# Patient Record
Sex: Female | Born: 1960
Health system: Southern US, Community
[De-identification: ages and names within clinical notes are randomized; demographics above are authoritative.]

## PROBLEM LIST (undated history)

## (undated) DIAGNOSIS — D649 Anemia, unspecified: Secondary | ICD-10-CM

## (undated) DIAGNOSIS — D219 Benign neoplasm of connective and other soft tissue, unspecified: Secondary | ICD-10-CM

## (undated) HISTORY — DX: Anemia, unspecified: D64.9

## (undated) HISTORY — PX: HERNIA REPAIR: SHX51

## (undated) HISTORY — PX: TUBAL LIGATION: SHX77

## (undated) HISTORY — DX: Benign neoplasm of connective and other soft tissue, unspecified: D21.9

---

## 2010-07-24 ENCOUNTER — Other Ambulatory Visit: Payer: Self-pay | Admitting: Geriatric Medicine

## 2010-07-24 DIAGNOSIS — D219 Benign neoplasm of connective and other soft tissue, unspecified: Secondary | ICD-10-CM

## 2010-07-25 ENCOUNTER — Ambulatory Visit
Admission: RE | Admit: 2010-07-25 | Discharge: 2010-07-25 | Disposition: A | Discharge status: Home / Self Care | Payer: BC Managed Care – PPO | Source: Ambulatory Visit | Attending: Geriatric Medicine | Admitting: Geriatric Medicine

## 2010-07-25 DIAGNOSIS — D219 Benign neoplasm of connective and other soft tissue, unspecified: Secondary | ICD-10-CM

## 2015-07-08 ENCOUNTER — Ambulatory Visit (INDEPENDENT_AMBULATORY_CARE_PROVIDER_SITE_OTHER): Payer: BLUE CROSS/BLUE SHIELD | Admitting: Gynecology

## 2015-07-08 ENCOUNTER — Encounter: Payer: Self-pay | Admitting: Gynecology

## 2015-07-08 VITALS — BP 128/76 | Ht 62.0 in | Wt 135.0 lb

## 2015-07-08 DIAGNOSIS — Z862 Personal history of diseases of the blood and blood-forming organs and certain disorders involving the immune mechanism: Secondary | ICD-10-CM | POA: Diagnosis not present

## 2015-07-08 DIAGNOSIS — N950 Postmenopausal bleeding: Secondary | ICD-10-CM

## 2015-07-08 DIAGNOSIS — N3281 Overactive bladder: Secondary | ICD-10-CM

## 2015-07-08 DIAGNOSIS — Z1159 Encounter for screening for other viral diseases: Secondary | ICD-10-CM | POA: Diagnosis not present

## 2015-07-08 DIAGNOSIS — D259 Leiomyoma of uterus, unspecified: Secondary | ICD-10-CM | POA: Diagnosis not present

## 2015-07-08 DIAGNOSIS — Z01419 Encounter for gynecological examination (general) (routine) without abnormal findings: Secondary | ICD-10-CM | POA: Diagnosis not present

## 2015-07-08 LAB — URINALYSIS W MICROSCOPIC + REFLEX CULTURE
BILIRUBIN URINE: NEGATIVE
CASTS: NONE SEEN [LPF]
Crystals: NONE SEEN [HPF]
Glucose, UA: NEGATIVE
Ketones, ur: NEGATIVE
Leukocytes, UA: NEGATIVE
NITRITE: NEGATIVE
Protein, ur: NEGATIVE
SPECIFIC GRAVITY, URINE: 1.025 (ref 1.001–1.035)
Yeast: NONE SEEN [HPF]
pH: 5 (ref 5.0–8.0)

## 2015-07-08 NOTE — Patient Instructions (Addendum)
Vejiga hiperactiva en adultos (Overactive Bladder, Adult) El trastorno de vejiga hiperactiva es un grupo de sntomas urinarios. Si tiene vejiga hiperactiva, puede sentir la necesidad repentina de orinar de inmediato. Despus de sentir esta necesidad urgente, tambin puede tener prdida de orina si no puede llegar al bao con la rapidez suficiente (incontinencia urinaria). Estos sntomas pueden interferir con su trabajo diario y las actividades sociales. Adems, los sntomas de vejiga hiperactiva pueden despertarlo durante la noche. El trastorno de vejiga hiperactiva afecta las seales nerviosas entre la vejiga y el cerebro. La vejiga puede recibir la seal de vaciarse antes de que est llena. Los msculos muy sensibles tambin pueden hacer que pierda orina demasiado pronto. CAUSAS Las causas de la vejiga hiperactiva pueden ser varias: Las causas posibles son las siguientes:  Infeccin urinaria.  Infeccin de los tejidos cercanos, como la prstata.  Agrandamiento de la prstata.  Estar embarazada de ms de un beb (embarazo mltiple).  Ciruga en el tero o la uretra.  Clculos en la vejiga, inflamacin o tumores.  Consumir cafena o alcohol en exceso.  Ciertos medicamentos, en especial lo que se toman para ayudar al organismo a eliminar el lquido extra (diurticos) al aumentar la produccin de [place].  Debilidad de los msculos y nervios, especialmente a causa de lo siguiente:  Lesin en la mdula espinal.  Ictus.  Esclerosis mltiple.  La enfermedad de Parkinson.  Diabetes. Esto puede producir un volumen de orina elevado que llena la vejiga tan rpido que la necesidad urgente de Garment/textile technologist se desencadena en forma muy intensa.  Estreimiento. La acumulacin de demasiada cantidad de heces puede ejercer presin en la vejiga. FACTORES DE RIESGO Puede correr un mayor riesgo de desarrollar vejiga hiperactiva si usted:  Es Media planner.  Fuma.  Est atravesando la  menopausia.  Tiene problemas de prstata.  Tiene una enfermedad neurolgica, como ictus, demencia, enfermedad de Parkinson o esclerosis mltiple (EM).  Ingiere alimentos o bebidas que irritan la vejiga. Entre ellos se incluyen el alcohol, los alimentos picantes y la cafena.  Tiene sobrepeso o es obeso. SIGNOS Y SNTOMAS  [employer] signos y los sntomas de vejiga hiperactiva se incluyen los siguientes:  Urgencia repentina e intensa de Garment/textile technologist.  Prdida de [place].  Orinar ocho o ms veces por da.  Despertarse dos o ms veces durante la noche para Garment/textile technologist. DIAGNSTICO El mdico puede sospechar la presencia de vejiga hiperactiva en funcin de los sntomas que [place]. El [employer] har un examen fsico y revisar su historia clnica. Pueden realizarle anlisis de [place] o de [place]. Por ejemplo, puede ser necesario realizar pruebas de la funcin de la vejiga para controlar la retencin de [place]. Es posible que tambin tenga que consultar a un mdico especialista en vas urinarias (urlogo). [place] para el trastorno de vejiga hiperactiva depende de la causa y la gravedad de su enfermedad. Ciertos tratamientos pueden Associate Professor del mdico o en la clnica. Tambin puede hacer cambios en su estilo de vida en su casa. [place] opciones se incluyen las siguientes: Tratamientos Statistician. El especialista utiliza sensores para ayudarlo a Personnel officer atento a las seales del cuerpo.  Llevar un registro diario de los momentos en que necesita orinar y qu sucede despus de la necesidad urgente de Garment/textile technologist. Esto puede ayudarlo a Electrical engineer.  Entrenamiento de la vejiga. Esto lo ayuda a aprender a Aeronautical engineer necesidad urgente de Garment/textile technologist al seguir un programa que lo obliga a Garment/textile technologist en intervalos regulares (  vaciamiento cronometrado). Al principio, es posible que tenga que esperar unos minutos despus de sentir la necesidad urgente de  Garment/textile technologist. Con el tiempo, debera poder [employer] con una hora de diferencia o ms.  Ejercicios de Kegel. Son ejercicios para fortalecer los msculos del piso plvico que sostienen la vejiga. La tonificacin de estos msculos puede ayudarlo a Chief Technology Officer las micciones, aun si hay hiperactividad en los msculos de la vejiga. Un especialista le ensear cmo hacer estos ejercicios en forma correcta. Deber practicarlos diariamente.  Prdida de peso. Si es obeso o tiene sobrepeso, perder [employer] sntomas de vejiga hiperactiva. Hable con su mdico acerca de cmo perder peso y si hay algn programa o mtodo especfico ms eficaz para usted.  Cambio en la dieta. Esto podra ayudar si el estreimiento empeora el trastorno de vejiga hiperactiva. El mdico o nutricionista puede explicarle de qu forma puede hacer cambios en su dieta para Theatre stage manager estreimiento. Tambin es posible que tenga que consumir menos cantidad de alcohol y cafena, y beber otros lquidos en distintos momentos del da.  Dejar de fumar.  Usar apsitos para Tax adviser las prdidas mientras espera que otros tratamientos surtan [place]. Tratamientos fsicos  Estimulacin elctrica. Los electrodos envan pulsos elctricos suaves para [employer] nervios o los msculos que ayudan a Chief Technology Officer la vejiga. En algunos casos, los electrodos se colocan fuera del cuerpo. En otros casos, pueden colocarse en el interior del cuerpo (implante). Este tratamiento puede demorar varios meses en surtir [employer].  Dispositivos complementarios. Las mujeres pueden necesitar un dispositivo de plstico que calce en la vagina y sostenga la vejiga (pesario). Medicamentos Varios medicamentos pueden ayudar a Chiropodist trastorno de vejiga hiperactiva y por lo general se utilizan junto con otros medicamentos. Algunos se inyectan en los msculos que participan en la miccin. Otros vienen en comprimidos. El mdico tambin puede indicarle lo  siguiente:  Antiespasmdicos. Estos medicamentos bloquean las seales que los nervios envan a la vejiga. Esto evita que la vejiga elimine orina en el momento incorrecto.  Antidepresivos tricclicos. Estos tipos de antidepresivos tambin [employer] de la vejiga. Ciruga  Puede implantarse un dispositivo que ayuda a Chief Technology Officer las seales nerviosas que indican cundo debe orinar.  Puede someterse a una ciruga de implante de electrodos para recibir Ship broker.  A veces, los casos muy graves de vejiga hiperactiva requieren de una ciruga para cambiar la forma de la vejiga. [place] los medicamentos solamente como se lo haya indicado el mdico.  Use los implantes o un pesario como se lo haya indicado el mdico.  Modifique su dieta o estilo de vida como se lo haya recomendado su mdico. Estos pueden incluir los siguientes:  Electronics engineer menos cantidad de lquido o beber en distintos momentos del Training and development officer. Si necesita orinar con frecuencia por las noches, es posible que tenga que dejar de beber lquidos apenas comienza la noche.  Reduzca la ingesta de cafena o alcohol. Ambos pueden empeorar el trastorno de vejiga hiperactiva. La cafena se encuentra en el caf, el t y los refrescos.  Haga ejercicios de Kegel para fortalecer los msculos.  Pierda peso si lo necesita.  Ingiera una dieta saludable y equilibrada para English as a second language teacher estreimiento.  Lleve un diario o libro de anotaciones para registrar la cantidad de lquidos que ingiere y cundo lo hace, y [place] cundo siente necesidad de Garment/textile technologist. Esto ayudar a su mdico a Administrator, arts. SOLICITE ATENCIN MDICA SI:  Los sntomas no mejoran despus de Chiropodist.  El dolor y [place].  Tiene necesidad urgente de orinar con mayor frecuencia.  Tiene fiebre. SOLICITE ATENCIN MDICA DE INMEDIATO SI: No puede controlar la vejiga para nada.   Esta  informacin no tiene Marine scientist el consejo del mdico. Asegrese de hacerle al mdico cualquier pregunta que tenga.   Document Released: 01/16/2012 Document Revised: 02/19/2014 Elsevier Interactive Patient Education 2016 [place] endometrio - Music therapist (Endometrial Biopsy, Care After) Siga estas instrucciones durante las prximas semanas. Estas indicaciones le proporcionan informacin general acerca de cmo deber cuidarse despus del procedimiento. El mdico tambin podr darle instrucciones ms especficas. El tratamiento se ha planificado de acuerdo a las prcticas mdicas actuales, pero a veces se producen problemas. Comunquese con el mdico si tiene algn problema o tiene dudas despus del procedimiento. QU ESPERAR DESPUS DEL PROCEDIMIENTO Despus del procedimiento, es tpico tener las siguientes sensaciones:  Sentir clicos leves y tendr una pequea cantidad de sangrado vaginal durante algunos das despus del procedimiento. Esto es normal. INSTRUCCIONES PARA EL CUIDADO EN EL HOGAR  Tome slo medicamentos de venta libre o recetados, segn las indicaciones del mdico.  No utilice tampones, duchas vaginales ni tenga relaciones sexuales hasta que el profesional la autorice.  Siga las indicaciones del mdico relacionadas con la restriccin a ciertas actividades, como ejercicios fsicos intensos o levantar objetos pesados. SOLICITE ATENCIN MDICA SI:  Tiene un sangrado abundante o sangra durante ms de 2 das despus del procedimiento.  Advierte un olor ftido que proviene de la vagina.  Siente escalofros o tiene fiebre.  Siente un dolor en el bajo vientre (abdominal) muy intenso. SOLICITE ATENCIN MDICA DE INMEDIATO SI:  Siente clicos intensos en el estmago o en la espalda.  Elimina cogulos grandes.  La hemorragia aumenta.  Se siente mareada, dbil, o se desmaya.   Esta informacin no tiene Marine scientist el consejo del  mdico. Asegrese de hacerle al mdico cualquier pregunta que tenga.   Document Released: 11/19/2012 Elsevier Interactive Patient Education 2016 [employer].  Uterine Fibroids Uterine fibroids are tissue masses (tumors) that can develop in the womb (uterus). They are also called leiomyomas. This type of tumor is not cancerous (benign) and does not spread to other parts of the body outside of the pelvic area, which is between the hip bones. Occasionally, fibroids may develop in the fallopian tubes, in the cervix, or on the support structures (ligaments) that surround the uterus. You can have one or many fibroids. Fibroids can vary in size, weight, and where they grow in the uterus. Some can become quite large. Most fibroids do not require medical treatment. CAUSES A fibroid can develop when a single uterine cell keeps growing (replicating). Most cells in the human body have a control mechanism that keeps them from replicating without control. SIGNS AND SYMPTOMS Symptoms may include:   Heavy bleeding during your period.  Bleeding or spotting between periods.  Pelvic pain and pressure.  Bladder problems, such as needing to urinate more often (urinary frequency) or urgently.  Inability to reproduce offspring (infertility).  Miscarriages. DIAGNOSIS Uterine fibroids are diagnosed through a physical exam. Your health care provider may feel the lumpy tumors during a pelvic exam. Ultrasonography and an MRI may be done to determine the size, location, and number of fibroids. TREATMENT Treatment may include:  Watchful waiting. This involves getting the fibroid checked by your health care provider to see if it grows or shrinks. Follow  your health care provider's recommendations for how often to have this checked.  Hormone medicines. These can be taken by mouth or given through an intrauterine device (IUD).  Surgery.  Removing the fibroids (myomectomy) or the uterus  (hysterectomy).  Removing blood supply to the fibroids (uterine artery embolization). If fibroids interfere with your fertility and you want to become pregnant, your health care provider may recommend having the fibroids removed.  HOME CARE INSTRUCTIONS  Keep all follow-up visits as directed by your health care provider. This is important.  Take medicines only as directed by your health care provider.  If you were prescribed a hormone treatment, take the hormone medicines exactly as directed.  Do not take aspirin, because it can cause bleeding.  Ask your health care provider about taking iron pills and increasing the amount of dark green, leafy vegetables in your diet. These actions can help to boost your blood iron levels, which may be affected by heavy menstrual bleeding.  Pay close attention to your period and tell your health care provider about any changes, such as:  Increased blood flow that requires you to use more pads or tampons than usual per month.  A change in the number of days that your period lasts per month.  A change in symptoms that are associated with your period, such as abdominal cramping or back pain. SEEK MEDICAL CARE IF:  You have pelvic pain, back pain, or abdominal cramps that cannot be controlled with medicines.  You have an increase in bleeding between and during periods.  You soak tampons or pads in a half hour or less.  You feel lightheaded, extra tired, or weak. SEEK IMMEDIATE MEDICAL CARE IF:  You faint.  You have a sudden increase in pelvic pain.   This information is not intended to replace advice given to you by your health care provider. Make sure you discuss any questions you have with your health care provider.   Document Released: 01/27/2000 Document Revised: 02/19/2014 Document Reviewed: 07/28/2013 Elsevier Interactive Patient Education 2016 [place] menopausia (Menopause) La menopausia es el perodo normal de la vida en el  cual los perodos menstruales cesan por completo. Se completa cuando no se tienen 12 perodos menstruales consecutivos. Ocurre generalmente en mujeres entre los 40 y los 28 aos. Muy rara vez una mujer tiene la menopausia antes de los 40 [place]. En la menopausia, los ovarios dejan de producir las hormonas femeninas estrgeno y Immunologist. Esto puede causar sntomas indeseables y Technical brewer a Technical sales engineer. A veces los sntomas aparecen entre 4 y 5 aos antes de que comience la menopausia. No existe una relacin [employer] y:  [place] anticonceptivos orales.  La cantidad de hijos que tuvo.  La raza.  La edad en que comenzaron los perodos menstruales (menarca). Las mujeres que fuman mucho y las que son muy delgadas pueden desarrollar la menopausia a una edad ms temprana. CAUSAS  Los ovarios dejan de producir las hormonas femeninas estrgeno y Immunologist.  Otras causas son:  [place] en la que se extirpan ambos ovarios.  Los ovarios que dejan de funcionar sin un motivo conocido.  Tumores de la glndula pituitaria en el cerebro.  Enfermedades que [employer] ovarios y la produccin de hormonas.  Radioterapia en el abdomen o la pelvis.  Quimioterapia que afecte a los ovarios. SNTOMAS   Acaloramiento.  Sudoracin nocturna.  Disminucin del deseo sexual.  Sequedad vaginal y adelgazamiento de la vagina, lo que causa relaciones sexuales dolorosas.  [employer]  de la piel y aparicin de [place].  Dolores de [place].  Cansancio.  Irritabilidad.  Problemas de memoria.  Aumento de [place].  Infeccin de la vejiga.  Aparicin de vello en el rostro y [place].  Infertilidad. Los sntomas ms graves pueden incluir:  Prdida de masa sea osteoporosis) que favorece la rotura de huesos(fracturas).  Depresin.  Endurecimiento y Public librarian de las arterias (aterosclerosis) lo que puede causar infarto e ictus. DIAGNSTICO   El perodo menstrual no aparece por 12 meses  seguidos.  Examen fsico.  Estudios hormonales de Herbalist. TRATAMIENTO  Hay muchas opciones de tratamiento y casi tantas dudas como opciones existen. La decisin de tratar o no los cambios que trae la menopausia es una decisin que realiza el profesional de acuerdo con cada Advertising copywriter. El Management consultant las opciones de tratamiento con usted. Juntos podrn decidir qu tratamiento es el mejor para usted. Las opciones de tratamiento pueden incluir:   Terapia hormonal (estrgenos y Immunologist).  Medicamentos no hormonales.  Tratamiento de los sntomas individuales con medicamentos (por ejemplo antidepresivos para la depresin).  Algunos medicamentos herbales pueden ayudar en sntomas especficos.  Psicoterapia con un psiclogo o psiquiatra.  Terapia grupal.  Cambios en el estilo de vida, como:  Consumir una dieta saludable.  Actividad fsica regular.  Limitar el consumo de cafena y alcohol.  Control del estrs y Public affairs consultant.  No realizar tratamiento. [place] todos los medicamentos como el mdico le indique.  Descanse y duerma lo suficiente.  Haga ejercicios regularmente.  Consuma una dieta que contenga calcio (bueno para los [place]) y productos derivados de la soja (actan como estrgenos).  Evite las bebidas alcohlicas.  No fume.  Si tiene sofocones, vstase en capas.  Tome suplementos de calcio y vitamina D para fortalecer los [place].  Puede utilizar lubricantes y humectantes de venta libre para la sequedad vaginal.  En algunos casos la terapia de grupo podr ayudarla.  La acupuntura puede ser de ayuda en ciertos casos. SOLICITE ATENCIN MDICA SI:   No est segura de estar en la menopausia.  Tiene sntomas de [place] y [place] asesoramiento y Clinical research associate.  Tiene 40 aos y an tiene Chartered certified accountant.  Tiene dolor durante las [employer].  La menopausia se ha completado (no ha  tenido el perodo menstrual durante 12 meses) y tiene un sangrado vaginal.  Necesita ser derivada a un especialista (gineclogo, psiquiatra, o psiclogo) para Arts administrator. SOLICITE ATENCIN MDICA DE INMEDIATO SI:   Siente una depresin intensa e incontrolable.  Tiene una hemorragia vaginal abundante.  Siente y cree que se ha fracturado un hueso.  Siente dolor al [employer].  Siente dolor en el pecho o en la pierna.  Tiene latidos cardacos rpidos (palpitaciones).  Siente dolor de cabeza intenso.  Tiene problemas de visin.  Siente un bulto en la mama.  Siente un dolor abdominal intenso o indigestin.   Esta informacin no tiene Marine scientist el consejo del mdico. Asegrese de hacerle al mdico cualquier pregunta que tenga.   Document Released: 03/12/2006 Document Revised: 10/01/2012 Elsevier Interactive Patient Education [employer].

## 2015-07-08 NOTE — Progress Notes (Addendum)
Allison Rice 17-Dec-1960 [id number]   History:    55 y.o.  for annual gyn exam is a new patient to the practice. [place] She stated that her mammogram was 3 years ago and was normal and her colonoscopy was 2 years ago. She has not had a Pap smear in over 3 years the last [place] done in [place] She reports no past history of any abnormal Pap smear. She has no vasomotor symptoms and states that her sister and mother reached the menopause in the mid 63s. Patient has several complaints today as follows:  #1 urinary frequency and nocturia but no dysuria #2 patient states that sometimes she'll have 2 or 3 menstrual cycles during the month. 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 #3 anemia  Past medical history,surgical history, family history and social history were all reviewed and documented in the EPIC chart.  Gynecologic History Patient's last menstrual period was 06/03/2015. Contraception: tubal ligation Last Pap: Over 3 years ago. Results were: normal Last mammogram: Over 3 years ago. Results were: normal  Obstetric History OB History  Gravida Para Term Preterm AB SAB TAB Ectopic Multiple Living  2 2        2     # Outcome Date GA Lbr Len/2nd Weight Sex Delivery Anes PTL Lv  2 Para     F Vag-Spont     1 Para     M Vag-Spont          ROS: A ROS was performed and pertinent positives and negatives are included in the history.  GENERAL: No fevers or chills. HEENT: No change in vision, no earache, sore throat or sinus congestion. NECK: No pain or stiffness. CARDIOVASCULAR: No chest pain or pressure. No palpitations. PULMONARY: No shortness of breath, cough or wheeze. GASTROINTESTINAL: No abdominal pain, nausea, vomiting or diarrhea, melena or bright red blood per rectum. GENITOURINARY: No urinary frequency, urgency, hesitancy or dysuria. MUSCULOSKELETAL:  No joint or muscle pain, no back pain, no recent trauma. DERMATOLOGIC: No rash, no itching, no lesions. ENDOCRINE: No polyuria, polydipsia, no heat or cold intolerance. No recent change in weight. HEMATOLOGICAL: No anemia or easy bruising or bleeding. NEUROLOGIC: No headache, seizures, numbness, tingling or weakness. PSYCHIATRIC: No depression, no loss of interest in normal activity or change in sleep pattern.     Exam: chaperone present  BP 128/76 mmHg  Ht 5\' 2"  (1.575 m)  Wt 135 lb (61.236 kg)  BMI 24.69 kg/m2  LMP 06/03/2015  Body mass index is 24.69 kg/(m^2).  General appearance : Well developed well nourished female. No acute distress HEENT: Eyes: no retinal hemorrhage or exudates,  Neck supple, trachea midline, no carotid bruits, no thyroidmegaly Lungs: Clear to auscultation, no rhonchi or wheezes, or rib retractions  Heart: Regular rate and rhythm, no murmurs or gallops Breast:Examined in sitting and supine position were symmetrical in appearance, no palpable masses or tenderness,  no skin retraction, no nipple inversion, no nipple discharge, no skin discoloration, no axillary or supraclavicular lymphadenopathy Abdomen: no palpable masses or tenderness, no rebound or guarding Extremities: no edema or skin discoloration or tenderness  Pelvic:  [place], Urethra, Skene Glands: Within normal limits             Vagina: No gross lesions or discharge  Cervix: No gross lesions or discharge  Uterus  anteverted, normal  size, shape and consistency, non-tender and mobile  Adnexa  Without masses or tenderness  Anus and perineum  normal   Rectovaginal  normal sphincter tone without palpated masses or tenderness             Hemoccult cards provided   Because of patient's irregular perimenopausal bleeding she was counseled for an endometrial biopsy. The cervix was cleansed with Betadine solution. The cervix was grasped with a single-tooth tenaculum and a sterile Pipelle was introduced into  the uterine cavity and tissue obtained and submitted for histological evaluation. CO2 tenaculum was removed and silver nitrate was applied to the cervix for hemostasis. Patient tolerated procedure well.  Urinalysis: Moderate bacteria, 0-2 RBC, 0-5 WBC  Assessment/Plan:  55 y.o. female for annual exam perimenopausal we'll check next week in a fasting state not only an [facility] but the following: Comprehensive metabolic panel, TSH, fasting lipid profile, CBC, and urinalysis. She will also schedule sonohysterogram for the following week. We are going to wait for the urine culture before treating it could be just a contaminant. Also we'll wait until after the sonohysterogram to assess her uterus to see indeed if maybe the fibroids may be compressing on the bladder and contributing to her urinary frequency and nocturia. Also with a sonohysterogram to rule out any intracavitary defect contributing to her irregular bleeding. Endometrial biopsy done today result pending at time of this dictation as well as her Pap smear. We will depend on the above studies before starting patient on any anti-cholinergic agent. Literature information was provided in Spanish on the following: Overactive bladder, perimenopause, an endometrial biopsy.   New CDC guidelines is recommending patients be tested once in her lifetime for hepatitis C antibody who were born between [birth year] through 1964. This was discussed with the patient today and has agreed to be tested today.  Patient was provided with requisition to schedule her mammogram.   Terrance Mass MD, 9:55 AM 07/08/2015

## 2015-07-08 NOTE — Addendum Note (Signed)
Addended by: Thurnell Garbe A on: 07/08/2015 10:12 AM   Modules accepted: Orders

## 2015-07-10 LAB — URINE CULTURE

## 2015-07-12 ENCOUNTER — Telehealth: Payer: Self-pay | Admitting: Gynecology

## 2015-07-12 ENCOUNTER — Other Ambulatory Visit: Payer: BLUE CROSS/BLUE SHIELD

## 2015-07-12 LAB — CBC WITH DIFFERENTIAL/PLATELET
Basophils Absolute: 0 cells/uL (ref 0–200)
Basophils Relative: 0 %
EOS PCT: 2 %
Eosinophils Absolute: 70 cells/uL (ref 15–500)
HCT: 31.7 % — ABNORMAL LOW (ref 35.0–45.0)
HEMOGLOBIN: 10.1 g/dL — AB (ref 11.7–15.5)
LYMPHS ABS: 1050 {cells}/uL (ref 850–3900)
Lymphocytes Relative: 30 %
MCH: 27.4 pg (ref 27.0–33.0)
MCHC: 31.9 g/dL — ABNORMAL LOW (ref 32.0–36.0)
MCV: 85.9 fL (ref 80.0–100.0)
MONOS PCT: 7 %
MPV: 11 fL (ref 7.5–12.5)
Monocytes Absolute: 245 cells/uL (ref 200–950)
NEUTROS ABS: 2135 {cells}/uL (ref 1500–7800)
Neutrophils Relative %: 61 %
PLATELETS: 200 10*3/uL (ref 140–400)
RBC: 3.69 MIL/uL — AB (ref 3.80–5.10)
RDW: 13.1 % (ref 11.0–15.0)
WBC: 3.5 10*3/uL — AB (ref 3.8–10.8)

## 2015-07-12 LAB — COMPREHENSIVE METABOLIC PANEL
ALBUMIN: 4 g/dL (ref 3.6–5.1)
ALT: 7 U/L (ref 6–29)
AST: 15 U/L (ref 10–35)
Alkaline Phosphatase: 56 U/L (ref 33–130)
BILIRUBIN TOTAL: 0.3 mg/dL (ref 0.2–1.2)
BUN: 14 mg/dL (ref 7–25)
CHLORIDE: 104 mmol/L (ref 98–110)
CO2: 27 mmol/L (ref 20–31)
CREATININE: 0.62 mg/dL (ref 0.50–1.05)
Calcium: 8.8 mg/dL (ref 8.6–10.4)
Glucose, Bld: 86 mg/dL (ref 65–99)
Potassium: 4.1 mmol/L (ref 3.5–5.3)
SODIUM: 139 mmol/L (ref 135–146)
TOTAL PROTEIN: 6.8 g/dL (ref 6.1–8.1)

## 2015-07-12 LAB — LIPID PANEL
CHOLESTEROL: 186 mg/dL (ref 125–200)
HDL: 82 mg/dL (ref >=46)
LDL CALC: 87 mg/dL (ref <130)
TRIGLYCERIDES: 86 mg/dL (ref <150)
Total CHOL/HDL Ratio: 2.3 Ratio (ref <=5.0)
VLDL: 17 mg/dL (ref <30)

## 2015-07-12 LAB — FOLLICLE STIMULATING HORMONE: FSH: 103.2 m[IU]/mL

## 2015-07-12 LAB — TSH: TSH: 2.73 m[IU]/L

## 2015-07-12 NOTE — Telephone Encounter (Signed)
07/12/15-Pt informed today that her [facility] will cover the sonohysterogram under her $10 copay. Per Kurt@BC -Ref#[id number]

## 2015-07-13 ENCOUNTER — Other Ambulatory Visit: Payer: Self-pay | Admitting: Gynecology

## 2015-07-13 DIAGNOSIS — N950 Postmenopausal bleeding: Secondary | ICD-10-CM

## 2015-07-13 LAB — PAP, TP IMAGING W/ HPV RNA, RFLX HPV TYPE 16,18/45: HPV mRNA, High Risk: NOT DETECTED

## 2015-07-13 LAB — HEPATITIS C ANTIBODY: HCV Ab: NEGATIVE

## 2015-07-19 ENCOUNTER — Other Ambulatory Visit: Payer: Self-pay | Admitting: Anesthesiology

## 2015-07-19 DIAGNOSIS — Z1211 Encounter for screening for malignant neoplasm of colon: Secondary | ICD-10-CM

## 2015-07-20 ENCOUNTER — Ambulatory Visit (INDEPENDENT_AMBULATORY_CARE_PROVIDER_SITE_OTHER): Payer: BLUE CROSS/BLUE SHIELD | Admitting: Gynecology

## 2015-07-20 ENCOUNTER — Other Ambulatory Visit: Payer: Self-pay | Admitting: Gynecology

## 2015-07-20 ENCOUNTER — Ambulatory Visit (INDEPENDENT_AMBULATORY_CARE_PROVIDER_SITE_OTHER): Payer: BLUE CROSS/BLUE SHIELD

## 2015-07-20 DIAGNOSIS — D251 Intramural leiomyoma of uterus: Secondary | ICD-10-CM

## 2015-07-20 DIAGNOSIS — N950 Postmenopausal bleeding: Secondary | ICD-10-CM

## 2015-07-20 DIAGNOSIS — D500 Iron deficiency anemia secondary to blood loss (chronic): Secondary | ICD-10-CM

## 2015-07-20 DIAGNOSIS — D259 Leiomyoma of uterus, unspecified: Secondary | ICD-10-CM

## 2015-07-20 DIAGNOSIS — N83202 Unspecified ovarian cyst, left side: Secondary | ICD-10-CM

## 2015-07-20 NOTE — Progress Notes (Signed)
   Patient is a 55 year old who was seen the office on May 26 for the first time as a new patient to the practice.She is [place] She stated that her mammogram was 3 years ago and was normal and her colonoscopy was 2 years ago. She has not had a Pap smear in over 3 years the last one done in [place] She reports no past history of any abnormal Pap smear. She has no vasomotor symptoms and states that her sister and mother reached the menopause in the mid 45s. Patient complained in the month of April she had 3 menstrual cycles and she is here for sonohysterogram today. She did have an endometrial biopsy at that last office visit and pathology report demonstrated the following:  Diagnosis Endometrium, biopsy, uterus - ATROPHIC ENDOMETRIUM WITH BREAKDOWN. - NO HYPERPLASIA, ATYPIA OR MALIGNANCY IDENTIFIED.  She also had a normal Pap smear.  Patient with past tubal ligation.  Ultrasound/sono hysterogram: Uterus measured 10.3 x 8.1 x 5.4 cm with endometrial stripe of 3.4 mm. Patient with a right intramural fibroid measuring 26 x 20 mm. Right ovary was normal. Left ovary thinwall cyst measuring 50 x 14 mm with a thin septum negative color flow. No fluid in the cul-de-sac. The cervix was then cleansed with Betadine solution and a sterile catheter was introduced into the uterine cavity and no intracavitary defect was noted.   Patient's recent blood work assisting of comprehensive metabolic panel, lipid profile, TSH were normal. Her CBC indicated that her hemoglobin was 10.1 hematocrit 31.7 and her FSH was elevated at 103.2.  Assessment/plan: 55 year old patient perimenopausal no vasomotor symptoms elevated FSH family history of menopause mid 39s. Patient with 3 episodes of bleeding April. Evaluation consisting of endometrial biopsy sonohysterogram was negative. A small left ovarian cyst was noted today with a thin septum and for this reason a CA 125 will be drawn today.  Patient will be started on iron supplementation 1 by mouth daily and will return back in 6 months for follow-up CBC. She will maintain a menstrual calendar that time if any further irregular bleeding we'll repeat the above evaluations. All the above information was provided in [place].

## 2015-07-20 NOTE — Patient Instructions (Addendum)
Marcador tumoral CA-125 (CA-125 Tumor Marker Test) POR QU ME DEBO REALIZAR ESTA PRUEBA? Esta prueba tiene como fin verificar el nivel del antgeno del cncer125 (CA-125) en la sangre. La prueba de marcador tumoral CA-125 puede ser til para detectar el cncer de ovario y se realiza solamente si se considera que la mujer corre un alto riesgo de presentar este tipo de cncer. El mdico puede recomendarle esta prueba si:  Usted tiene importantes antecedentes familiares de cncer de ovario.  Usted tiene un defecto gentico en el antgeno del cncer de mama (BRCA, por sus siglas en ingls). Si ya le han diagnosticado cncer de ovario, el mdico puede hacerle esta prueba como ayuda para identificar el grado de la enfermedad y controlar cmo responde al tratamiento. QU TIPO DE MUESTRA SE TOMA? Para esta prueba, se extrae una muestra de sangre. Por lo general, para extraerla, se introduce una aguja en una vena. CMO DEBO PREPARARME PARA ESTA PRUEBA? No se requiere una preparacin para esta prueba. QU SON LOS VALORES DE REFERENCIA? Los valores de referencia son los valores saludables establecidos despus de realizarle el anlisis a un grupo grande de personas sanas. Pueden variar entre diferentes personas, laboratorios y hospitales. Es su responsabilidad retirar el resultado del estudio. Consulte en el laboratorio o en el departamento en el que fue realizado el estudio cundo y cmo podr obtener los resultados. El valor de referencia para esta prueba es de 0a 35unidades/ml o menos de 35unidades/l (unidades SI). QU SIGNIFICAN LOS RESULTADOS? Los niveles ms altos de CA-125 pueden indicar lo siguiente:  Ciertos tipos de cncer, entre ellos:  Cncer de ovario.  Cncer de pncreas.  Cncer de colon.  Cncer de pulmn.  Cncer de mama.  Linfomas.  Trastornos no cancerosos (benignos), entre ellos:  Cirrosis.  Embarazo.  Endometriosis.  Pancreatitis.  Enfermedad plvica  inflamatoria (EPI). Hable con el mdico sobre los resultados, las opciones de tratamiento y, si es necesario, la necesidad de realizar ms estudios. Hable con el mdico si tiene alguna pregunta sobre los resultados.   Esta informacin no tiene como fin reemplazar el consejo del mdico. Asegrese de hacerle al mdico cualquier pregunta que tenga.   Document Released: 11/19/2012 Document Revised: 02/19/2014 Elsevier Interactive Patient Education 2016 [employer] Inc. Quiste ovrico (Ovarian Cyst) Un quiste ovrico es una bolsa llena de lquido que se forma en el ovario. Los ovarios son los rganos pequeos que producen vulos en las mujeres. Se pueden formar varios tipos de quistes en los ovarios. La mayora no son cancerosos. Muchos de ellos no causan problemas y con frecuencia desaparecen solos. Algunos pueden provocar sntomas y requerir tratamiento. Los tipos ms comunes de quistes ovricos son los siguientes:  Quistes funcionales: estos quistes pueden aparecer todos los meses durante el ciclo menstrual. Esto es normal. Estos quistes suelen desaparecer con el prximo ciclo menstrual si la mujer no queda embarazada. En general, los quistes funcionales no tienen sntomas.  Endometriomas: estos quistes se forman a partir del tejido que recubre el tero. Tambin se denominan "quistes de chocolate" porque se llenan de sangre que se vuelve marrn. Este tipo de quiste puede provocar dolor en la zona inferior del abdomen durante la relacin sexual y con el perodo menstrual.  Cistoadenomas: este tipo se desarrolla a partir de las clulas que se ubican en el exterior del ovario. Estos quistes pueden ser muy grandes y causar dolor en la zona inferior del abdomen y durante la relacin sexual. Este tipo de quiste puede girar sobre s mismo, cortar   el suministro de sangre y causar un dolor intenso. Tambin se puede romper con facilidad y provocar mucho dolor.  Quistes dermoides: este tipo de quiste a veces se  encuentra en ambos ovarios. Estos quistes pueden contener diferentes tipos de tejidos del organismo, como piel, dientes, pelo o cartlago. Generalmente no tienen sntomas, a menos que sean muy grandes.  Quistes tecalutenicos: aparecen cuando se produce demasiada cantidad de cierta hormona (gonadotropina corinica humana) que estimula en exceso al ovario para que produzca vulos. Esto es ms frecuente despus de procedimientos que ayudan a la concepcin de un beb (fertilizacin in vitro). CAUSAS   Los medicamentos para la fertilidad pueden provocar una afeccin mediante la cual se forman mltiples quistes de gran tamao en los ovarios. Esta se denomina sndrome de hiperestimulacin ovrica.  El sndrome del ovario poliqustico es una afeccin que puede causar desequilibrios hormonales, los cuales pueden dar como resultado quistes ovricos no funcionales. SIGNOS Y SNTOMAS  Muchos quistes ovricos no causan sntomas. Si se presentan sntomas, stos pueden ser:  Dolor o molestias en la pelvis.  Dolor en la parte baja del abdomen.  Dolor durante las relaciones sexuales.  Aumento del permetro abdominal (hinchazn).  Perodos menstruales anormales.  Aumento del dolor en los perodos menstruales.  Cese de los perodos menstruales sin estar embarazada. DIAGNSTICO  Estos quistes se descubren comnmente durante un examen de rutina o una exploracin ginecolgica anual. Es posible que se ordenen otros estudios para obtener ms informacin sobre el quiste. Estos estudios pueden ser:  Ecografas.  Radiografas de la pelvis.  Tomografa computada.  Resonancia magntica.  Anlisis de sangre. TRATAMIENTO  Muchos de los quistes ovricos desaparecen por s solos, sin tratamiento. Es probable que el mdico quiera controlar el quiste regularmente durante 2 o 3meses para ver si se produce algn cambio. En el caso de las mujeres en la menopausia, es particularmente importante controlar de cerca al  quiste ya que el ndice de cncer de ovario en las mujeres menopusicas es ms alto. Cuando se requiere tratamiento, este puede incluir cualquiera de los siguientes:  Un procedimiento para drenar el quiste (aspiracin). Esto se puede realizar mediante el uso de una aguja grande y una ecografa. Tambin se puede hacer a travs de un procedimiento laparoscpico, En este procedimiento, se inserta un tubo delgado que emite luz y que tiene una pequea cmara en un extremo (laparoscopio) a travs de una pequea incisin.  Ciruga para extirpar el quiste completo. Esto se puede realizar mediante una ciruga laparoscpica o una ciruga abierta, la cual implica realizar una incisin ms grande en la parte inferior del abdomen.  Tratamiento hormonal o pldoras anticonceptivas. Estos mtodos a veces se usan para ayudar a disolver un quiste. INSTRUCCIONES PARA EL CUIDADO EN EL HOGAR   Tome solo medicamentos de venta libre o recetados, segn las indicaciones del mdico.  Concurra a las consultas de control con su mdico segn las indicaciones.  Hgase exmenes plvicos regulares y pruebas de Papanicolaou. SOLICITE ATENCIN MDICA SI:   Los perodos se atrasan, son irregulares, dolorosos o cesan.  El dolor plvico o abdominal no desaparece.  El abdomen se agranda o se hincha.  Siente presin en la vejiga o no puede vaciarla completamente.  Siente dolor durante las relaciones sexuales.  Tiene una sensacin de hinchazn, presin o molestias en el estmago.  Pierde peso sin razn aparente.  Siente un malestar generalizado.  Est estreida.  Pierde el apetito.  Le aparece acn.  Nota un aumento del vello corporal y facial.    Aumenta de peso sin hacer modificaciones en su actividad fsica y en su dieta habitual.  Sospecha que est embarazada. SOLICITE ATENCIN MDICA DE INMEDIATO SI:   Siente cada vez ms dolor abdominal.  Tiene malestar estomacal (nuseas) y vomita.  Tiene fiebre que se  presenta de manera repentina.  Siente dolor abdominal al defecar.  Sus perodos menstruales son ms abundantes que lo habitual. ASEGRESE DE QUE:   Comprende estas instrucciones.  Controlar su afeccin.  Recibir ayuda de inmediato si no mejora o si empeora.   Esta informacin no tiene como fin reemplazar el consejo del mdico. Asegrese de hacerle al mdico cualquier pregunta que tenga.   Document Released: 11/08/2004 Document Revised: 02/03/2013 Elsevier Interactive Patient Education 2016 Elsevier Inc.  

## 2015-07-21 LAB — CA 125: CA 125: 10 U/mL (ref <35)

## 2016-04-17 ENCOUNTER — Ambulatory Visit (INDEPENDENT_AMBULATORY_CARE_PROVIDER_SITE_OTHER): Payer: BLUE CROSS/BLUE SHIELD | Admitting: Gynecology

## 2016-04-17 ENCOUNTER — Encounter: Payer: Self-pay | Admitting: Gynecology

## 2016-04-17 VITALS — BP 122/80

## 2016-04-17 DIAGNOSIS — R350 Frequency of micturition: Secondary | ICD-10-CM

## 2016-04-17 DIAGNOSIS — N3281 Overactive bladder: Secondary | ICD-10-CM

## 2016-04-17 DIAGNOSIS — R3915 Urgency of urination: Secondary | ICD-10-CM

## 2016-04-17 LAB — URINALYSIS W MICROSCOPIC + REFLEX CULTURE
Bilirubin Urine: NEGATIVE
CASTS: NONE SEEN [LPF]
CRYSTALS: NONE SEEN [HPF]
Glucose, UA: NEGATIVE
HGB URINE DIPSTICK: NEGATIVE
KETONES UR: NEGATIVE
Leukocytes, UA: NEGATIVE
NITRITE: NEGATIVE
PROTEIN: NEGATIVE
RBC / HPF: NONE SEEN RBC/HPF (ref <=2)
SPECIFIC GRAVITY, URINE: 1.01 (ref 1.001–1.035)
YEAST: NONE SEEN [HPF]
pH: 7 (ref 5.0–8.0)

## 2016-04-17 MED ORDER — FESOTERODINE FUMARATE ER 4 MG PO TB24: 4.0000 mg | ORAL_TABLET | Freq: Every day | ORAL | 11 refills | Status: DC

## 2016-04-17 NOTE — Progress Notes (Signed)
   Patient is a 56 year old that presented to the office today with complaint of suprapubic discomfort and low back discomfort. She denied any fever, chills, nausea, vomiting. She had visited her [place] recently a stated she saw urologist which she was having the symptoms and started on Cipro twice a day for 7 days which she will finish tomorrow. Patient states this suprapubic discomfort is been going on for quite some time. She also states that she gets of 56 times a night to urinate and has urinary frequency during the day. On questioning her she does not consume large amounts of fluids or any caffeine-containing products. Patient denied any past history of any kidney stones.  Exam: Back: No CVA tenderness Abdomen: Soft nontender no rebound or guarding Pelvic: Bartholin urethra Skene was within normal limits Vagina: No lesions or discharge Uterus: Anteverted normal size shape and consistency some mild suprapubic discomfort Adnexa: No palpable masses or tenderness Rectal exam: Not done  Urinalysis few bacteria otherwise normal culture pending  Assessment/plan: Patient with signs and symptoms of detrusor dyssynergia (overactive bladder) with going to start her on Toviaz 4 mg daily for her symptoms. If her urgency and nocturia improved but she continues to have suprapubic discomfort I'm going to refer to the urologist for possible evaluation for underlying interstitial cystitis. I've asked her to finish her antibiotic that was prescribed by urologist in the [place] Literature information was provided on overactive bladder and on interstitial cystitis.

## 2016-04-17 NOTE — Patient Instructions (Signed)
Cistitis intersticial (Interstitial Cystitis) La cistitis intersticial es una afeccin que causa inflamacin de la vejiga. La vejiga es un rgano hueco que se encuentra en la parte inferior del abdomen. Almacena la orina que producen los riones. La cistitis intersticial puede causar dolor en la zona de la vejiga. Tambin es posible que tenga la necesidad frecuente y urgente de Garment/textile technologist. La gravedad de la cistitis intersticial puede variar de Ardelia Mems persona a la otra. Puede presentar exacerbaciones de la afeccin, que luego quizs desaparezcan durante un [place]. Para muchas personas, esta afeccin se convierte en un problema a largo plazo. CAUSAS Se desconoce la causa de esta afeccin. FACTORES DE RIESGO Es ms probable que esta afeccin se manifieste en las mujeres. SNTOMAS Los sntomas de la cistitis intersticial varan y pueden modificarse con Physiological scientist. Entre los sntomas se pueden incluir los siguientes:  Molestias o dolor en la zona de la vejiga. Pueden variar de leves a graves. La intensidad del dolor puede variar en funcin de si la vejiga est llena con orina o vaca.  Dolor en la pelvis.  Necesidad urgente de Garment/textile technologist.  Ganas frecuentes de Garment/textile technologist.  Captiva.  Sangrado en forma de puntitos en la pared de la vejiga. En las mujeres, los sntomas a menudo Scientist, research (physical sciences). DIAGNSTICO Esta afeccin se diagnostica evaluando los sntomas y descartando otras causas. Se le practicar un examen fsico. Pueden hacerle diversos estudios para descartar otras afecciones. Entre los ms frecuentes, se incluyen los siguientes:  Anlisis de [place].  Una cistoscopa. En este estudio, se utiliza un instrumento similar a un telescopio muy pequeo para observar la vejiga.  Biopsia. La biopsia consiste en extraer Truddie Coco de tejido de la pared de la vejiga para examinar con un microscopio. TRATAMIENTO La cistitis intersticial no tiene cura, pero hay  tratamientos para controlar los sntomas. Trabaje en estrecha colaboracin con el mdico a fin de hallar los tratamientos que sean ms eficaces para su caso. Las opciones de tratamiento son las siguientes:  Medicamentos que alivien el dolor y ayuden a reducir la cantidad de veces que sienta la necesidad de Garment/textile technologist.  Entrenamiento de la vejiga. Esto consiste en aprender [employer] de controlar las micciones, por ejemplo:  Orinar en horarios programados.  Entrenarse para Radiation protection practitioner.  Hacer ejercicios (ejercicios de Kegel) para fortalecer los msculos que controlan el flujo de [place].  Cambios en el estilo de vida, por ejemplo, modificar la dieta o tomar medidas para Scientific laboratory technician.  Uso de un dispositivo que emite estimulacin elctrica para Dietitian.  Un procedimiento que extiende la vejiga al llenarla de aire o lquido.  Ciruga. Esto es raro. Se realiza solamente en casos extremos si los otros tratamientos no son eficaces. [place] los medicamentos solamente como se lo haya indicado el mdico.  Practique las tcnicas de entrenamiento de la vejiga como se lo hayan indicado.  Lleve un diario de micciones para determinar qu alimentos, lquidos o [employer] sntomas.  Use el diario de micciones para programar las veces que va al bao. Si saldr de su casa, organcese de modo de poder cumplir con la programacin de micciones.  Asegrese de orinar antes de salir de su casa y antes de [place].  Haga los ejercicios de Kegel como se lo haya indicado el mdico.  No beba alcohol.  No consuma ningn producto que contenga tabaco, lo que incluye cigarrillos, tabaco de Higher education careers adviser o Psychologist, sport and exercise.  Si necesita ayuda para dejar de fumar, consulte al mdico.  Haga cambios en la dieta como se lo haya indicado el mdico. Quizs deba evitar los alimentos picantes y los alimentos con un alto contenido de [place].  Limite  la ingesta de bebidas que estimulen la miccin. Estas bebidas incluyen refrescos, caf y t.  Concurra a todas las visitas de control como se lo haya indicado el mdico. Esto es importante. SOLICITE ATENCIN MDICA SI:  Los sntomas no mejoran despus de Chiropodist.  El dolor y [place].  Tiene necesidad urgente de orinar con mayor frecuencia.  Tiene fiebre. SOLICITE ATENCIN MDICA DE INMEDIATO SI:  No puede controlar la vejiga para nada. Esta informacin no tiene Marine scientist el consejo del mdico. Asegrese de hacerle al mdico cualquier pregunta que tenga. Document Released: 05/17/2008 Document Revised: 02/19/2014 Document Reviewed: 10/06/2013 Elsevier Interactive Patient Education  2017 [place] hiperactiva en adultos (Overactive Bladder, Adult) El trastorno de vejiga hiperactiva es un grupo de sntomas urinarios. Si tiene vejiga hiperactiva, puede sentir la necesidad repentina de orinar de inmediato. Despus de sentir esta necesidad urgente, tambin puede tener prdida de orina si no puede llegar al bao con la rapidez suficiente (incontinencia urinaria). Estos sntomas pueden interferir con su trabajo diario y las actividades sociales. [place], los sntomas de vejiga hiperactiva pueden despertarlo durante la noche. El trastorno de vejiga hiperactiva afecta las seales nerviosas entre la vejiga y el cerebro. La vejiga puede recibir la seal de vaciarse antes de que est llena. Los msculos muy sensibles tambin pueden hacer que pierda orina demasiado pronto. CAUSAS Las causas de la vejiga hiperactiva pueden ser varias: Las causas posibles son las siguientes:  Infeccin urinaria.  Infeccin de los tejidos cercanos, como la prstata.  Agrandamiento de la prstata.  Estar embarazada de ms de un beb (embarazo mltiple).  Ciruga en el tero o la uretra.  Clculos en la vejiga, inflamacin o tumores.  Consumir cafena o alcohol en  exceso.  Ciertos medicamentos, en especial lo que se toman para ayudar al organismo a eliminar el lquido extra (diurticos) al aumentar la produccin de [place].  Debilidad de los msculos y nervios, especialmente a causa de lo siguiente:  Lesin en la mdula espinal.  Ictus.  Esclerosis mltiple.  La enfermedad de Parkinson.  Diabetes. Esto puede producir un volumen de orina elevado que llena la vejiga tan rpido que la necesidad urgente de Garment/textile technologist se desencadena en forma muy intensa.  Estreimiento. La acumulacin de demasiada cantidad de heces puede ejercer presin en la vejiga. FACTORES DE RIESGO Puede correr un mayor riesgo de desarrollar vejiga hiperactiva si usted:  Es Media planner.  [place].  Est atravesando la menopausia.  Tiene problemas de prstata.  Tiene una enfermedad neurolgica, como ictus, demencia, enfermedad de Parkinson o esclerosis mltiple (EM).  Ingiere alimentos o bebidas que irritan la vejiga. Entre ellos se incluyen el alcohol, los alimentos picantes y la cafena.  Tiene sobrepeso o es obeso. SIGNOS Y SNTOMAS [employer] signos y los sntomas de vejiga hiperactiva se incluyen los siguientes:  Urgencia repentina e intensa de Garment/textile technologist.  Prdida de [place].  Orinar ocho o ms veces por da.  Despertarse dos o ms veces durante la noche para Garment/textile technologist. DIAGNSTICO El mdico puede sospechar la presencia de vejiga hiperactiva en funcin de los sntomas que [place]. El [employer] har un examen fsico y revisar su historia clnica. Pueden realizarle anlisis de [place] o de [place]. Por ejemplo, puede ser  necesario realizar pruebas de la funcin de la vejiga para controlar la retencin de [place]. Es posible que tambin tenga que consultar a un mdico especialista en vas urinarias (urlogo). [place] para el trastorno de vejiga hiperactiva depende de la causa y la gravedad de su enfermedad. Ciertos tratamientos pueden Associate Professor  del mdico o en la clnica. Tambin puede hacer cambios en su estilo de vida en su casa. [place] opciones se incluyen las siguientes: Tratamientos Statistician. El especialista utiliza sensores para ayudarlo a Personnel officer atento a las seales del cuerpo.  Llevar un registro diario de los momentos en que necesita orinar y qu sucede despus de la necesidad urgente de Garment/textile technologist. Esto puede ayudarlo a Electrical engineer.  Entrenamiento de la vejiga. Esto lo ayuda a aprender a Aeronautical engineer necesidad urgente de Garment/textile technologist al seguir un programa que lo obliga a Garment/textile technologist en intervalos regulares (vaciamiento cronometrado). Al principio, es posible que tenga que esperar unos minutos despus de sentir la necesidad urgente de Garment/textile technologist. Con el tiempo, debera poder [employer] con una hora de diferencia o ms.  Ejercicios de Kegel. Son ejercicios para fortalecer los msculos del piso plvico que sostienen la vejiga. La tonificacin de estos msculos puede ayudarlo a Chief Technology Officer las micciones, aun si hay hiperactividad en los msculos de la vejiga. Un especialista le ensear cmo hacer estos ejercicios en forma correcta. Deber practicarlos diariamente.  Prdida de peso. Si es obeso o tiene sobrepeso, perder [employer] sntomas de vejiga hiperactiva. Hable con su mdico acerca de cmo perder peso y si hay algn programa o mtodo especfico ms eficaz para usted.  Cambio en la dieta. Esto podra ayudar si el estreimiento empeora el trastorno de vejiga hiperactiva. El mdico o nutricionista puede explicarle de qu forma puede hacer cambios en su dieta para Theatre stage manager estreimiento. Tambin es posible que tenga que consumir menos cantidad de alcohol y cafena, y beber otros lquidos en distintos momentos del da.  Dejar de fumar.  Usar apsitos para Tax adviser las prdidas mientras espera que otros tratamientos surtan [place]. Tratamientos fsicos  Estimulacin elctrica. Los  electrodos envan pulsos elctricos suaves para [employer] nervios o los msculos que ayudan a Chief Technology Officer la vejiga. En algunos casos, los electrodos se colocan fuera del cuerpo. En otros casos, pueden colocarse en el interior del cuerpo (implante). Este tratamiento puede demorar varios meses en surtir [employer].  Dispositivos complementarios. Las mujeres pueden necesitar un dispositivo de plstico que calce en la vagina y sostenga la vejiga (pesario). Medicamentos Varios medicamentos pueden ayudar a Chiropodist trastorno de vejiga hiperactiva y por lo general se utilizan junto con otros medicamentos. Algunos se inyectan en los msculos que participan en la miccin. Otros vienen en comprimidos. El mdico tambin puede indicarle lo siguiente:  Antiespasmdicos. Estos medicamentos bloquean las seales que los nervios envan a la vejiga. Esto evita que la vejiga elimine orina en el momento incorrecto.  Antidepresivos tricclicos. Estos tipos de antidepresivos tambin [employer] de la vejiga. Cir[place]  Puede implantarse un dispositivo que ayuda a Chief Technology Officer las seales nerviosas que indican cundo debe orinar.  Puede someterse a una ciruga de implante de electrodos para recibir Ship broker.  A veces, los casos muy graves de vejiga hiperactiva requieren de una ciruga para cambiar la forma de la vejiga. [place] los medicamentos solamente como se lo haya indicado el mdico.  Use los implantes o un pesario  como se lo haya indicado el mdico.  Modifique su dieta o estilo de vida como se lo haya recomendado su mdico. Estos pueden incluir los siguientes:  Beber menos cantidad de lquido o beber en distintos momentos del Training and development officer. Si necesita orinar con frecuencia por las noches, es posible que tenga que dejar de beber lquidos apenas comienza la noche.  Reduzca la ingesta de cafena o alcohol. Ambos pueden empeorar el trastorno de vejiga  hiperactiva. La cafena se encuentra en el caf, el t y los refrescos.  Haga ejercicios de Kegel para fortalecer los msculos.  Pierda peso si lo necesita.  Ingiera una dieta saludable y equilibrada para English as a second language teacher estreimiento.  Lleve un diario o libro de anotaciones para registrar la cantidad de lquidos que ingiere y cundo lo hace, y [place] cundo siente necesidad de Garment/textile technologist. Esto ayudar a su mdico a Administrator, arts. SOLICITE ATENCIN MDICA SI:  Los sntomas no mejoran despus de Chiropodist.  El dolor y [place].  Tiene necesidad urgente de orinar con mayor frecuencia.  Tiene fiebre. SOLICITE ATENCIN MDICA DE INMEDIATO SI: No puede controlar la vejiga para nada. Esta informacin no tiene Marine scientist el consejo del mdico. Asegrese de hacerle al mdico cualquier pregunta que tenga. Document Released: 01/16/2012 Document Revised: 02/19/2014 Document Reviewed: 06/24/2013 Elsevier Interactive Patient Education  2017 [employer].

## 2016-04-18 ENCOUNTER — Telehealth: Payer: Self-pay | Admitting: *Deleted

## 2016-04-18 MED ORDER — OXYBUTYNIN CHLORIDE ER 5 MG PO TB24: 5.0000 mg | ORAL_TABLET | Freq: Every day | ORAL | 11 refills | Status: DC

## 2016-04-18 NOTE — Telephone Encounter (Signed)
The ditropan doesn't come in 4 mg in epic, it comes in 5 mg. If this is the dose you want I will send in for patient.

## 2016-04-18 NOTE — Telephone Encounter (Signed)
If Ditropan XL is covered call in prescription for 4 mg one by mouth daily #30 with 11 refills

## 2016-04-18 NOTE — Telephone Encounter (Signed)
Pt was prescribed Toviaz 4 mg tablet on 04/17/16 medication is not covered as pt has not tried or failed formulary generic medication such as Ditropan XL, Detrol and [place]. Are any of these listed medication an option? Please advise

## 2016-04-18 NOTE — Telephone Encounter (Signed)
That is correct thank you 5 mg

## 2016-04-18 NOTE — Telephone Encounter (Signed)
Rx sent 

## 2016-04-19 LAB — URINE CULTURE: ORGANISM ID, BACTERIA: NO GROWTH

## 2016-06-27 ENCOUNTER — Encounter: Payer: Self-pay | Admitting: Gynecology

## 2016-07-12 ENCOUNTER — Ambulatory Visit (INDEPENDENT_AMBULATORY_CARE_PROVIDER_SITE_OTHER): Payer: BLUE CROSS/BLUE SHIELD | Admitting: Gynecology

## 2016-07-12 ENCOUNTER — Encounter: Payer: Self-pay | Admitting: Gynecology

## 2016-07-12 VITALS — BP 132/80 | Ht 62.0 in | Wt 136.0 lb

## 2016-07-12 DIAGNOSIS — R350 Frequency of micturition: Secondary | ICD-10-CM

## 2016-07-12 DIAGNOSIS — N3000 Acute cystitis without hematuria: Secondary | ICD-10-CM | POA: Diagnosis not present

## 2016-07-12 DIAGNOSIS — D251 Intramural leiomyoma of uterus: Secondary | ICD-10-CM | POA: Diagnosis not present

## 2016-07-12 DIAGNOSIS — N3281 Overactive bladder: Secondary | ICD-10-CM

## 2016-07-12 DIAGNOSIS — Z01419 Encounter for gynecological examination (general) (routine) without abnormal findings: Secondary | ICD-10-CM | POA: Diagnosis not present

## 2016-07-12 DIAGNOSIS — N83202 Unspecified ovarian cyst, left side: Secondary | ICD-10-CM | POA: Diagnosis not present

## 2016-07-12 LAB — URINALYSIS W MICROSCOPIC + REFLEX CULTURE
Bilirubin Urine: NEGATIVE
Casts: NONE SEEN [LPF]
Crystals: NONE SEEN [HPF]
Glucose, UA: NEGATIVE
HGB URINE DIPSTICK: NEGATIVE
Ketones, ur: NEGATIVE
Nitrite: NEGATIVE
PH: 5.5 (ref 5.0–8.0)
PROTEIN: NEGATIVE
RBC / HPF: NONE SEEN RBC/HPF (ref <=2)
Specific Gravity, Urine: <1.005 (ref 1.001–1.035)
YEAST: NONE SEEN [HPF]

## 2016-07-12 MED ORDER — TOLTERODINE TARTRATE ER 2 MG PO CP24: 2.0000 mg | ORAL_CAPSULE | Freq: Every day | ORAL | 11 refills | Status: DC

## 2016-07-12 MED ORDER — NITROFURANTOIN MONOHYD MACRO 100 MG PO CAPS: 100.0000 mg | ORAL_CAPSULE | Freq: Two times a day (BID) | ORAL | 0 refills | Status: DC

## 2016-07-12 MED ORDER — NITROFURANTOIN MONOHYD MACRO 100 MG PO CAPS: ORAL_CAPSULE | ORAL | 5 refills | Status: DC

## 2016-07-12 NOTE — Progress Notes (Signed)
Allison Rice 10/14/1960 [id number]   History:    56 y.o.  for annual gyn exam who presented to the office today with several complaints as follows: #1 patient has had in the past history of recurrent urinary tract infections. On further questioning it appears that she develops signs and symptoms of urinary tract infection shortly after intercourse. Today she was experiencing some frequency but no dysuria. #2 patient with complaints of urgency throughout the day and nocturia whereby she would get up 3 times at night regardless of fluid consumption #3 patient with past history of left ovarian cyst in 2017 had a number CA 125 did not return for follow-up ultrasound. She also has small intramural leiomyomas. #4 patient menopausal last menstrual cycle reported to be December 2017 patient with no vasomotor symptoms. Patient on no hormone replacement therapy. Patient had an [facility] last year 103.2.  Patient stated she had a normal colonoscopy 3 years ago and reports no past history of any abnormal Pap smears.  Past medical history,surgical history, family history and social history were all reviewed and documented in the EPIC chart.  Gynecologic History Patient's last menstrual period was 02/12/2016 (approximate). Contraception: tubal ligation Last Pap: 2017. Results were: normal Last mammogram: 2017. Results were: normal  Obstetric History OB History  Gravida Para Term Preterm AB Living  2 2       2   SAB TAB Ectopic Multiple Live Births               # Outcome Date GA Lbr Len/2nd Weight Sex Delivery Anes PTL Lv  2 Para     F Vag-Spont     1 51     M Vag-Spont          ROS: A ROS was performed and pertinent positives and negatives are included in the history.  GENERAL: No fevers or chills. HEENT: No change in vision, no earache, sore throat or sinus congestion. NECK: No pain or stiffness. CARDIOVASCULAR: No chest pain or pressure. No palpitations. PULMONARY: No shortness of breath,  cough or wheeze. GASTROINTESTINAL: No abdominal pain, nausea, vomiting or diarrhea, melena or bright red blood per rectum. GENITOURINARY: No urinary frequency, urgency, hesitancy or dysuria. MUSCULOSKELETAL: No joint or muscle pain, no back pain, no recent trauma. DERMATOLOGIC: No rash, no itching, no lesions. ENDOCRINE: No polyuria, polydipsia, no heat or cold intolerance. No recent change in weight. HEMATOLOGICAL: No anemia or easy bruising or bleeding. NEUROLOGIC: No headache, seizures, numbness, tingling or weakness. PSYCHIATRIC: No depression, no loss of interest in normal activity or change in sleep pattern.     Exam: chaperone present  BP 132/80   Ht 5\' 2"  (1.575 m)   Wt 136 lb (61.7 kg)   LMP 02/12/2016 (Approximate)   BMI 24.87 kg/m   Body mass index is 24.87 kg/m.  General appearance : Well developed well nourished female. No acute distress HEENT: Eyes: no retinal hemorrhage or exudates,  Neck supple, trachea midline, no carotid bruits, no thyroidmegaly Lungs: Clear to auscultation, no rhonchi or wheezes, or rib retractions  Heart: Regular rate and rhythm, no murmurs or gallops Breast:Examined in sitting and supine position were symmetrical in appearance, no palpable masses or tenderness,  no skin retraction, no nipple inversion, no nipple discharge, no skin discoloration, no axillary or supraclavicular lymphadenopathy Abdomen: no palpable masses or tenderness, no rebound or guarding Extremities: no edema or skin discoloration or tenderness  Pelvic:  [place], Urethra, Skene Glands: Within normal limits  Vagina: No gross lesions or discharge  Cervix: No gross lesions or discharge  Uterus  anteverted upper limits of normal, , non-tender and mobile  Adnexa  Without masses or tenderness  Anus and perineum  normal   Rectovaginal  normal sphincter tone without palpated masses or tenderness             Hemoccult cards provided   Urinalysis: Moderate bacteria, 10-20  WBC no red blood cells seen  Assessment/Plan:  56 y.o. 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 #3 for her overactive bladder (detrusor dyssynergia) she's going to be started on Detrol LA 2 mg daily. Risk benefits and pros and cons of the medication were discussed and literature information was provided. Patient denies any history of glaucoma or hypertension. #4 patient menopausal with no vasomotor symptoms does not need hormone replacement therapy we did talk about the importance of calcium vitamin D and weightbearing exercises for osteoporosis prevention #5 patient with history of left ovarian cyst in 2017 with normal CA 125 and history of fibroid uterus will return back next week for a follow-up ultrasound as well as she will come fasting for a fasting lipid profile, comprehensive metabolic panel, and CBC. #6 patient does not need any Pap smear this year according to the new guidelines. #7 patient was provided with fecal Hemoccult cards dismissed to the office for testing which she'll bring with her when she comes for ultrasound next week.  An additional 15 minutes was spent discussing overactive bladder, recurrent cystitis, menopause and ovarian cyst. Literature information and instructions were provided in written format in Spanish.   Terrance Mass MD, 3:08 PM 07/12/2016

## 2016-07-12 NOTE — Patient Instructions (Signed)
Tolterodine tablets Qu es este medicamento? La TOLTERODINA se utiliza para tratar la vejiga hiperactiva. Este medicamento puede reducir la necesidad de Garment/textile technologist con frecuencia. Tambin puede ayudar a evitar que una persona se orine accidentalmente. Este medicamento puede ser utilizado para otros usos; si tiene alguna pregunta consulte con su proveedor de atencin mdica o con su farmacutico. MARCAS COMUNES: Detrol Qu le debo informar a mi profesional de la salud antes de tomar este medicamento? Necesita saber si usted presenta alguno de los siguientes problemas o situaciones: -dificultad para Garment/textile technologist -glaucoma -obstruccin intestinal -pulso cardiaco irregular o tiene un miembro de su familia que tiene pulso cardiaco irregular -enfermedad renal -enfermedad heptica -miastenia gravis -una reaccin alrgica o inusual a la tolterodina, fesoterodina, a otros medicamentos, alimentos, colorantes o conservantes -si est embarazada o buscando quedar embarazada -si est amamantando a un beb Cmo debo utilizar este medicamento? Tome este medicamento por va oral con un vaso de agua. Siga las instrucciones de la etiqueta del [place]. Tome sus dosis a intervalos regulares. No tome su medicamento con una frecuencia mayor a la indicada. Hable con su pediatra para informarse acerca del uso de este medicamento en nios. Puede requerir atencin especial. Sobredosis: Pngase en contacto inmediatamente con un centro toxicolgico o una sala de urgencia si usted cree que haya tomado demasiado medicamento. ATENCIN: [employer] es solo para usted. No comparta este medicamento con nadie. Qu sucede si me olvido de una dosis? Si olvida una dosis, tmela lo antes posible. Si es casi la hora de la prxima dosis, tome slo esa dosis. No tome dosis adicionales o dobles. Qu puede interactuar con este medicamento? -claritromicina -ciclosporina -eritromicina -fluoxetina -medicamentos para infecciones  micticas, como fluconazol, itraconazol, quetoconazol o voriconazol -vinblastina Puede ser que esta lista no menciona todas las posibles interacciones. Informe a su profesional de [employer]	[place] de [employer] productos a base de hierbas, medicamentos de [place] o suplementos nutritivos que est tomando. Si usted fuma, consume bebidas alcohlicas o si utiliza drogas ilegales, indqueselo tambin a su profesional de [employer]	[place]. Algunas sustancias pueden interactuar con su medicamento. A qu debo estar atento al usar Coca[employer]Cola? Pueden transcurrir 2  3 meses antes de que obtenga el beneficio mximo de Coca-Cola. Es posible que sea necesario limitar la ingesta de t, caf, refrescos con cafena y alcohol. Estas bebidas pueden empeorar los sntomas. Puede experimentar mareos o somnolencia. No conduzca ni utilice maquinaria, ni haga nada que Associate Professor en estado de alerta hasta que sepa cmo le afecta este medicamento. No se siente ni se ponga de pie con rapidez, especialmente si es un paciente de edad avanzada. Esto ayuda a Psychiatrist de mareos o [place]. Se le podr secar la boca. Masticar chicle sin azcar, chupar caramelos duros y beber agua en abundancia le ayudar a mantener la boca hmeda. Si el problema no desaparece o es severo, consulte a su mdico. Este medicamento puede resecarle los ojos y provocar visin borrosa. Si [place] lentes de contacto, puede sentir ciertas molestias. Las gotas lubricantes pueden ser tiles. Si el problema no desaparece o es severo, consulte a su mdico de los ojos. Evite temperaturas elevadas extremas. Este medicamento puede hacer que sude menos que lo normal. La temperatura de su cuerpo puede elevarse a niveles peligrosos, lo que puede causar un golpe de Freight forwarder. Qu efectos secundarios puedo tener al [employer] este medicamento? Efectos secundarios que debe informar a su mdico o a Barrister's clerk de la salud tan pronto como sea posible: -  reacciones  alrgicas como erupcin cutnea, picazn o urticarias, hinchazn de la cara, labios o lengua -problemas respiratorios -confusin -dificultad para orinar -pulso cardaco rpido, irregular -alucinaciones -hinchazn de pies, manos Efectos secundarios que, por lo general, no requieren atencin mdica (debe informarlos a su mdico o a su profesional de la salud si persisten o si son molestos): -cambios en la visin -estreimiento -boca, ojos secos -dolor de cabeza -Jonita Albee, somnolencia -Higher education careers adviser Puede ser que esta lista no menciona todos los posibles efectos secundarios. Comunquese a su mdico por asesoramiento mdico [employer]. Usted puede informar los efectos secundarios a la FDA por telfono al 1-800-FDA-1088. Dnde debo guardar mi medicina? Mantngala fuera del alcance de los nios. Gurdela a [employer], entre 15 y 59 grados C (42 y 86 grados F). Deseche los medicamentos que no haya utilizado, despus de la fecha de vencimiento. ATENCIN: Este folleto es un resumen. Puede ser que no cubra toda la posible informacin. Si usted tiene preguntas acerca de esta medicina, consulte con su mdico, su farmacutico o su profesional de Technical sales engineer.  2018 Elsevier/Gold Standard (2014-03-23 00:00:00) Vejiga hiperactiva en adultos (Overactive Bladder, Adult) El trastorno de vejiga hiperactiva es un grupo de sntomas urinarios. Si tiene vejiga hiperactiva, puede sentir la necesidad repentina de orinar de inmediato. Despus de sentir esta necesidad urgente, tambin puede tener prdida de orina si no puede llegar al bao con la rapidez suficiente (incontinencia urinaria). Estos sntomas pueden interferir con su trabajo diario y las actividades sociales. Adems, los sntomas de vejiga hiperactiva pueden despertarlo durante la noche. El trastorno de vejiga hiperactiva afecta las seales nerviosas entre la vejiga y el cerebro. La vejiga puede recibir la seal de vaciarse  antes de que est llena. Los msculos muy sensibles tambin pueden hacer que pierda orina demasiado pronto. CAUSAS Las causas de la vejiga hiperactiva pueden ser varias: Las causas posibles son las siguientes:  Infeccin urinaria.  Infeccin de los tejidos cercanos, como la prstata.  Agrandamiento de la prstata.  Estar embarazada de ms de un beb (embarazo mltiple).  Ciruga en el tero o la uretra.  Clculos en la vejiga, inflamacin o tumores.  Consumir cafena o alcohol en exceso.  Ciertos medicamentos, en especial lo que se toman para ayudar al organismo a eliminar el lquido extra (diurticos) al aumentar la produccin de [place].  Debilidad de los msculos y nervios, especialmente a causa de lo siguiente: ? Lesin en la mdula espinal. ? Ictus. ? Esclerosis mltiple. ? La enfermedad de Parkinson.  Diabetes. Esto puede producir un volumen de orina elevado que llena la vejiga tan rpido que la necesidad urgente de Garment/textile technologist se desencadena en forma muy intensa.  Estreimiento. La acumulacin de demasiada cantidad de heces puede ejercer presin en la vejiga. FACTORES DE RIESGO Puede correr un mayor riesgo de desarrollar vejiga hiperactiva si usted:  Es Media planner.  Fuma.  Est atravesando la menopausia.  Tiene problemas de prstata.  Tiene una enfermedad neurolgica, como ictus, demencia, enfermedad de Parkinson o esclerosis mltiple (EM).  Ingiere alimentos o bebidas que irritan la vejiga. Entre ellos se incluyen el alcohol, los alimentos picantes y la cafena.  Tiene sobrepeso o es obeso. SIGNOS Y SNTOMAS [employer] signos y los sntomas de vejiga hiperactiva se incluyen los siguientes:  Urgencia repentina e intensa de Garment/textile technologist.  Prdida de [place].  Orinar ocho o ms veces por da.  Despertarse dos o ms veces durante la noche para Garment/textile technologist. DIAGNSTICO El mdico puede sospechar la presencia de  vejiga hiperactiva en funcin de los sntomas que presente. El  [employer] har un examen fsico y revisar su historia clnica. Pueden realizarle anlisis de [place] o de [place]. Por ejemplo, puede ser necesario realizar pruebas de la funcin de la vejiga para controlar la retencin de [place]. Es posible que tambin tenga que consultar a un mdico especialista en vas urinarias (urlogo). [place] para el trastorno de vejiga hiperactiva depende de la causa y la gravedad de su enfermedad. Ciertos tratamientos pueden Associate Professor del mdico o en la clnica. Tambin puede hacer cambios en su estilo de vida en su casa. [place] opciones se incluyen las siguientes: Tratamientos Statistician. El especialista utiliza sensores para ayudarlo a Personnel officer atento a las seales del cuerpo.  Llevar un registro diario de los momentos en que necesita orinar y qu sucede despus de la necesidad urgente de Garment/textile technologist. Esto puede ayudarlo a Electrical engineer.  Entrenamiento de la vejiga. Esto lo ayuda a aprender a Aeronautical engineer necesidad urgente de Garment/textile technologist al seguir un programa que lo obliga a Garment/textile technologist en intervalos regulares (vaciamiento cronometrado). Al principio, es posible que tenga que esperar unos minutos despus de sentir la necesidad urgente de Garment/textile technologist. Con el tiempo, debera poder [employer] con una hora de diferencia o ms.  Ejercicios de Kegel. Son ejercicios para fortalecer los msculos del piso plvico que sostienen la vejiga. La tonificacin de estos msculos puede ayudarlo a Chief Technology Officer las micciones, aun si hay hiperactividad en los msculos de la vejiga. Un especialista le ensear cmo hacer estos ejercicios en forma correcta. Deber practicarlos diariamente.  Prdida de peso. Si es obeso o tiene sobrepeso, perder [employer] sntomas de vejiga hiperactiva. Hable con su mdico acerca de cmo perder peso y si hay algn programa o mtodo especfico ms eficaz para usted.  Cambio en la dieta. Esto  podra ayudar si el estreimiento empeora el trastorno de vejiga hiperactiva. El mdico o nutricionista puede explicarle de qu forma puede hacer cambios en su dieta para Theatre stage manager estreimiento. Tambin es posible que tenga que consumir menos cantidad de alcohol y cafena, y beber otros lquidos en distintos momentos del da.  Dejar de fumar.  Usar apsitos para Tax adviser las prdidas mientras espera que otros tratamientos surtan [place]. Tratamientos fsicos  Estimulacin elctrica. Los electrodos envan pulsos elctricos suaves para [employer] nervios o los msculos que ayudan a Chief Technology Officer la vejiga. En algunos casos, los electrodos se colocan fuera del cuerpo. En otros casos, pueden colocarse en el interior del cuerpo (implante). Este tratamiento puede demorar varios meses en surtir [employer].  Dispositivos complementarios. Las mujeres pueden necesitar un dispositivo de plstico que calce en la vagina y sostenga la vejiga (pesario). Medicamentos Varios medicamentos pueden ayudar a Chiropodist trastorno de vejiga hiperactiva y por lo general se utilizan junto con otros medicamentos. Algunos se inyectan en los msculos que participan en la miccin. Otros vienen en comprimidos. El mdico tambin puede indicarle lo siguiente:  Antiespasmdicos. Estos medicamentos bloquean las seales que los nervios envan a la vejiga. Esto evita que la vejiga elimine orina en el momento incorrecto.  Antidepresivos tricclicos. Estos tipos de antidepresivos tambin [employer] de la vejiga. Ciruga  Puede implantarse un dispositivo que ayuda a Chief Technology Officer las seales nerviosas que indican cundo debe orinar.  Puede someterse a una ciruga de implante de electrodos para recibir Ship broker.  A veces, los casos muy graves de vejiga hiperactiva requieren de [place]  para cambiar la forma de la vejiga. [place] los medicamentos solamente como se lo  haya indicado el mdico.  Use los implantes o un pesario como se lo haya indicado el mdico.  Modifique su dieta o estilo de vida como se lo haya recomendado su mdico. Estos pueden incluir los siguientes: ? Electronics engineer menos cantidad de lquido o beber en distintos momentos del Training and development officer. Si necesita orinar con frecuencia por las noches, es posible que tenga que dejar de beber lquidos apenas comienza la noche. ? Reduzca la ingesta de cafena o alcohol. Ambos pueden empeorar el trastorno de vejiga hiperactiva. La cafena se encuentra en el caf, el t y los refrescos. ? Haga ejercicios de Kegel para fortalecer los msculos. ? Pierda peso si lo necesita. ? Ingiera una dieta saludable y equilibrada para evitar el estreimiento.  Lleve un diario o libro de anotaciones para registrar la cantidad de lquidos que ingiere y cundo lo hace, y [place] cundo siente necesidad de Garment/textile technologist. Esto ayudar a su mdico a Administrator, arts. SOLICITE ATENCIN MDICA SI:  Los sntomas no mejoran despus de Chiropodist.  El dolor y [place].  Tiene necesidad urgente de orinar con mayor frecuencia.  Tiene fiebre. SOLICITE ATENCIN MDICA DE INMEDIATO SI: No puede controlar la vejiga para nada. Esta informacin no tiene Marine scientist el consejo del mdico. Asegrese de hacerle al mdico cualquier pregunta que tenga. Document Released: 01/16/2012 Document Revised: 02/19/2014 Document Reviewed: 06/24/2013 Elsevier Interactive Patient Education  [employer].

## 2016-07-14 LAB — URINE CULTURE

## 2016-07-17 ENCOUNTER — Encounter: Payer: Self-pay | Admitting: Gynecology

## 2016-07-17 ENCOUNTER — Other Ambulatory Visit: Payer: Self-pay | Admitting: Gynecology

## 2016-07-17 ENCOUNTER — Other Ambulatory Visit: Payer: BLUE CROSS/BLUE SHIELD

## 2016-07-17 DIAGNOSIS — Z1231 Encounter for screening mammogram for malignant neoplasm of breast: Secondary | ICD-10-CM

## 2016-07-17 LAB — COMPREHENSIVE METABOLIC PANEL WITH GFR
ALT: 8 U/L (ref 6–29)
AST: 16 U/L (ref 10–35)
Albumin: 4.2 g/dL (ref 3.6–5.1)
Alkaline Phosphatase: 58 U/L (ref 33–130)
BUN: 16 mg/dL (ref 7–25)
CO2: 28 mmol/L (ref 20–31)
Calcium: 9.2 mg/dL (ref 8.6–10.4)
Chloride: 103 mmol/L (ref 98–110)
Creat: 0.74 mg/dL (ref 0.50–1.05)
Glucose, Bld: 89 mg/dL (ref 65–99)
Potassium: 4.1 mmol/L (ref 3.5–5.3)
Sodium: 140 mmol/L (ref 135–146)
Total Bilirubin: 0.4 mg/dL (ref 0.2–1.2)
Total Protein: 7.3 g/dL (ref 6.1–8.1)

## 2016-07-17 LAB — LIPID PANEL
Cholesterol: 190 mg/dL (ref <200)
HDL: 87 mg/dL (ref >50)
LDL Cholesterol: 89 mg/dL (ref <100)
Total CHOL/HDL Ratio: 2.2 Ratio (ref <5.0)
Triglycerides: 68 mg/dL (ref <150)
VLDL: 14 mg/dL (ref <30)

## 2016-07-17 LAB — CBC WITH DIFFERENTIAL/PLATELET
Basophils Absolute: 42 {cells}/uL (ref 0–200)
Basophils Relative: 1 %
Eosinophils Absolute: 168 {cells}/uL (ref 15–500)
Eosinophils Relative: 4 %
HCT: 35 % (ref 35.0–45.0)
Hemoglobin: 11.2 g/dL — ABNORMAL LOW (ref 11.7–15.5)
Lymphocytes Relative: 32 %
Lymphs Abs: 1344 {cells}/uL (ref 850–3900)
MCH: 28.4 pg (ref 27.0–33.0)
MCHC: 32 g/dL (ref 32.0–36.0)
MCV: 88.6 fL (ref 80.0–100.0)
MPV: 10.4 fL (ref 7.5–12.5)
Monocytes Absolute: 210 {cells}/uL (ref 200–950)
Monocytes Relative: 5 %
Neutro Abs: 2436 {cells}/uL (ref 1500–7800)
Neutrophils Relative %: 58 %
Platelets: 176 K/uL (ref 140–400)
RBC: 3.95 MIL/uL (ref 3.80–5.10)
RDW: 13.4 % (ref 11.0–15.0)
WBC: 4.2 K/uL (ref 3.8–10.8)

## 2016-07-24 ENCOUNTER — Other Ambulatory Visit: Payer: Self-pay | Admitting: Anesthesiology

## 2016-07-24 DIAGNOSIS — Z1211 Encounter for screening for malignant neoplasm of colon: Secondary | ICD-10-CM

## 2016-07-26 ENCOUNTER — Ambulatory Visit (INDEPENDENT_AMBULATORY_CARE_PROVIDER_SITE_OTHER): Payer: BLUE CROSS/BLUE SHIELD | Admitting: Gynecology

## 2016-07-26 ENCOUNTER — Ambulatory Visit (INDEPENDENT_AMBULATORY_CARE_PROVIDER_SITE_OTHER): Payer: BLUE CROSS/BLUE SHIELD

## 2016-07-26 ENCOUNTER — Encounter: Payer: Self-pay | Admitting: Gynecology

## 2016-07-26 ENCOUNTER — Ambulatory Visit
Admission: RE | Admit: 2016-07-26 | Discharge: 2016-07-26 | Disposition: A | Discharge status: Home / Self Care | Payer: BLUE CROSS/BLUE SHIELD | Source: Ambulatory Visit | Attending: Gynecology | Admitting: Gynecology

## 2016-07-26 VITALS — BP 130/78

## 2016-07-26 DIAGNOSIS — Z8742 Personal history of other diseases of the female genital tract: Secondary | ICD-10-CM

## 2016-07-26 DIAGNOSIS — N7011 Chronic salpingitis: Secondary | ICD-10-CM | POA: Diagnosis not present

## 2016-07-26 DIAGNOSIS — D251 Intramural leiomyoma of uterus: Secondary | ICD-10-CM | POA: Diagnosis not present

## 2016-07-26 DIAGNOSIS — N83202 Unspecified ovarian cyst, left side: Secondary | ICD-10-CM

## 2016-07-26 DIAGNOSIS — Z87440 Personal history of urinary (tract) infections: Secondary | ICD-10-CM | POA: Diagnosis not present

## 2016-07-26 DIAGNOSIS — N3281 Overactive bladder: Secondary | ICD-10-CM

## 2016-07-26 DIAGNOSIS — Z1231 Encounter for screening mammogram for malignant neoplasm of breast: Secondary | ICD-10-CM

## 2016-07-26 NOTE — Progress Notes (Signed)
   Patient is a 56 year old that presented to the office today to discuss her ultrasound. She was seen the office for annual exam on May 31 of this year and review of her record indicated that back in 2017 she had an ultrasound which demonstrated that she had a left ovarian cyst measuring 50 x 14 mm with a thin septum negative color flow. A CA-125 was normal. At that time of the office visit she was complaining of recurrent urinary tract infections and it was determined that it was at the time of intercourse and she was started on Macrobid one by mouth after intercourse and she stated that it has helped her tremendously. For her overactive bladder she was started on Detrol LA 2 mg daily which she states that has helped her she does not have any nocturia or frequency during the day. She was otherwise asymptomatic today. She is scheduled for mammogram later today.  Ultrasound today: Uterus measured 9.2 x 7.4 x 4.5 cm within reach or strep a 3.3 mm. Small intramural fibroid 18 x 23 mm. Right ovary was normal. Left ovarian cysts no longer present. She did have a cystic avascular tubular structure consistent with a hydrosalpinx measuring 42 x 12 x 31 mm. No fluid in the cul-de-sac.  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

## 2017-06-11 ENCOUNTER — Encounter: Payer: Self-pay | Admitting: Obstetrics & Gynecology

## 2017-06-11 ENCOUNTER — Ambulatory Visit: Payer: BLUE CROSS/BLUE SHIELD | Admitting: Obstetrics & Gynecology

## 2017-06-11 VITALS — BP 130/80

## 2017-06-11 DIAGNOSIS — R102 Pelvic and perineal pain: Secondary | ICD-10-CM

## 2017-06-11 DIAGNOSIS — R3915 Urgency of urination: Secondary | ICD-10-CM

## 2017-06-11 DIAGNOSIS — N898 Other specified noninflammatory disorders of vagina: Secondary | ICD-10-CM

## 2017-06-11 DIAGNOSIS — R350 Frequency of micturition: Secondary | ICD-10-CM

## 2017-06-11 LAB — URINALYSIS, COMPLETE W/RFL CULTURE
Bilirubin Urine: NEGATIVE
GLUCOSE, UA: NEGATIVE
HGB URINE DIPSTICK: NEGATIVE
HYALINE CAST: NONE SEEN /LPF
Ketones, ur: NEGATIVE
Nitrites, Initial: NEGATIVE
PROTEIN: NEGATIVE
RBC / HPF: NONE SEEN /HPF (ref 0–2)
Specific Gravity, Urine: 1.025 (ref 1.001–1.03)
pH: 5 (ref 5.0–8.0)

## 2017-06-11 LAB — WET PREP FOR TRICH, YEAST, CLUE

## 2017-06-11 NOTE — Patient Instructions (Signed)
1. Frequency of urination Frequency with urgency.  No burning.  Urine analysis just mildly perturbed, pending urine culture.  Will wait on urine culture results before deciding on treatment.  Recommend increased intake of water and avoidance of caffeine products. - Urinalysis,Complete w/RFL Culture  2. Urgency of urination Urgency of urination for at least 7 months.  Probably no cystitis.  Pending urine culture.  Recommendations on avoidance of bladder irritants discussed.  Encouraged to drink more water.  Refer to urology for evaluation. - Urinalysis,Complete w/RFL Culture  3. Pelvic pressure in female Gynecologic exam normal today.   - Urinalysis,Complete w/RFL Culture  4. Vaginal discharge Wet prep negative.  Patient reassured. - WET PREP FOR Allison Rice, CLUE  Allison Rice, it was a pleasure meeting you today!  I will inform you of your urine culture results as soon as they are available.  I will see you soon for your annual gynecologic exam in June.

## 2017-06-11 NOTE — Progress Notes (Signed)
    Allison Rice 11-Jun-1960 [id number]        57 y.o.  G2P2 Married  RP: Urinary frequency with urgency x 1 month  HPI:  Feels like she needs to empty her bladder frequently, urgently x >1 month.  No incontinence.  No burning with miction.  Mild pelvic pressure and lower back discomfort.  Mild vaginal discharge, no odor, no itching.  No fever.    OB History  Gravida Para Term Preterm AB Living  2 2       2   SAB TAB Ectopic Multiple Live Births               # Outcome Date GA Lbr Len/2nd Weight Sex Delivery Anes PTL Lv  2 Para     F Vag-Spont     1 Para     M Vag-Spont       Past medical history,surgical history, problem list, medications, allergies, family history and social history were all reviewed and documented in the EPIC chart.   Directed ROS with pertinent positives and negatives documented in the history of present illness/assessment and plan.  Exam:  Vitals:   06/11/17 1232  BP: 130/80   General appearance:  Normal  Abdomen: Normal  CVAT neg bilaterally  Gynecologic exam: Vulva normal.  Speculum:  Cervix/Vagina normal.  Normal vaginal secretions.  Wet prep done.  Bimanual exam:  Uterus AV, mobile, normal volume, NT.  No adnexal mass, NT.  Wet prep negative  U/A: Yellow clear, nitrites negative, white blood cells 6-10, red blood cells negative, few bacteria. Pending U. Culture.   Assessment/Plan:  57 y.o. G2P2   1. Frequency of urination Frequency with urgency.  No burning.  Urine analysis just mildly perturbed, pending urine culture.  Will wait on urine culture results before deciding on treatment.  Recommend increased intake of water and avoidance of caffeine products. - Urinalysis,Complete w/RFL Culture  2. Urgency of urination Urgency of urination for at least 7 months.  Probably no cystitis.  Pending urine culture.  Recommendations on avoidance of bladder irritants discussed.  Encouraged to drink more water.  Refer to urology for evaluation. -  Urinalysis,Complete w/RFL Culture  3. Pelvic pressure in female Gynecologic exam normal today.   - Urinalysis,Complete w/RFL Culture  4. Vaginal discharge Wet prep negative.  Patient reassured. - WET PREP FOR [place], YEAST, CLUE  Counseling on above issues and coordination of care more than 50% for 25 minutes.  Princess Bruins MD, 12:58 PM 06/11/2017

## 2017-06-12 ENCOUNTER — Telehealth: Payer: Self-pay | Admitting: *Deleted

## 2017-06-12 LAB — CULTURE INDICATED

## 2017-06-12 NOTE — Telephone Encounter (Signed)
Referral faxed to [facility] they will call pt to schedule. Pt aware they will call her.

## 2017-06-12 NOTE — Telephone Encounter (Signed)
-----   Message from Princess Bruins, MD sent at 06/11/2017  1:14 PM EDT ----- Regarding: Urinary urgency Urinary urgency and frequency.  U/A very mild abnormality, U. Culture pending.

## 2017-06-13 LAB — URINE CULTURE
MICRO NUMBER:: 90530849
SPECIMEN QUALITY: ADEQUATE

## 2017-07-02 NOTE — Telephone Encounter (Signed)
Pt told urology she will call them back in June she is leaving out of town

## 2017-09-26 ENCOUNTER — Encounter: Payer: Self-pay | Admitting: Obstetrics & Gynecology

## 2017-09-26 ENCOUNTER — Ambulatory Visit: Payer: BLUE CROSS/BLUE SHIELD | Admitting: Obstetrics & Gynecology

## 2017-09-26 VITALS — BP 122/76 | Ht 62.0 in | Wt 134.0 lb

## 2017-09-26 DIAGNOSIS — N951 Menopausal and female climacteric states: Secondary | ICD-10-CM

## 2017-09-26 DIAGNOSIS — Z01419 Encounter for gynecological examination (general) (routine) without abnormal findings: Secondary | ICD-10-CM | POA: Diagnosis not present

## 2017-09-26 DIAGNOSIS — Z9851 Tubal ligation status: Secondary | ICD-10-CM

## 2017-09-26 NOTE — Progress Notes (Signed)
Allison Rice 03-Apr-1960 [id number]   History:    57 y.o. G2P2L2 Married.  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  RP:  Established patient presenting for annual gyn exam   HPI: Light menses about every 3 months.  No hot flashes or night sweats.  No pelvic pain.  No pain with intercourse.  Urine normal.  Bowel movements normal.  Breasts normal.  Body mass index 24.51.  Very little physical activity.  Will follow up here for fasting health labs.  Past medical history,surgical history, family history and social history were all reviewed and documented in the EPIC chart.  Gynecologic History Patient's last menstrual period was 02/12/2016 (exact date). Contraception: tubal ligation Last Pap: 06/2015. Results were: Negative/HPV HR neg Last mammogram: 07/2016. Results were: Negative Bone Density: Never Colonoscopy: 3 yrs ago   Obstetric History OB History  Gravida Para Term Preterm AB Living  _0 SAB TAB Ectopic Multiple Live Births               # Outcome Date GA Lbr Len/2nd Weight Sex Delivery Anes PTL Lv  2 Para     F Vag-Spont     1 Para     M Vag-Spont        ROS: A ROS was performed and pertinent positives and negatives are included in the history.  GENERAL: No fevers or chills. HEENT: No change in vision, no earache, sore throat or sinus congestion. NECK: No pain or stiffness. CARDIOVASCULAR: No chest pain or pressure. No palpitations. PULMONARY: No shortness of breath, cough or wheeze. GASTROINTESTINAL: No abdominal pain, nausea, vomiting or diarrhea, melena or bright red blood per rectum. GENITOURINARY: No urinary frequency, urgency, hesitancy or dysuria. MUSCULOSKELETAL: No joint or muscle pain, no back pain, no recent trauma. DERMATOLOGIC: No rash, no itching, no lesions. ENDOCRINE: No polyuria, polydipsia, no heat or cold intolerance. No recent change in weight. HEMATOLOGICAL: No anemia or easy bruising or bleeding. NEUROLOGIC: No headache, seizures, numbness, tingling or  weakness. PSYCHIATRIC: No depression, no loss of interest in normal activity or change in sleep pattern.     Exam:   BP 122/76   Ht 5' 2" (1.575 m)   Wt 134 lb (60.8 kg)   LMP 02/12/2016 (Exact Date)   BMI 24.51 kg/m   Body mass index is 24.51 kg/m.  General appearance : Well developed well nourished female. No acute distress HEENT: Eyes: no retinal hemorrhage or exudates,  Neck supple, trachea midline, no carotid bruits, no thyroidmegaly Lungs: Clear to auscultation, no rhonchi or wheezes, or rib retractions  Heart: Regular rate and rhythm, no murmurs or gallops Breast:Examined in sitting and supine position were symmetrical in appearance, no palpable masses or tenderness,  no skin retraction, no nipple inversion, no nipple discharge, no skin discoloration, no axillary or supraclavicular lymphadenopathy Abdomen: no palpable masses or tenderness, no rebound or guarding Extremities: no edema or skin discoloration or tenderness  Pelvic: Vulva: Normal             Vagina: No gross lesions or discharge  Cervix: No gross lesions or discharge  Uterus  AV, normal size, shape and consistency, non-tender and mobile  Adnexa  Without masses or tenderness  Anus: Normal   Assessment/Plan:  57 y.o. female for annual exam   1. Well female exam with routine gynecological exam Normal gynecologic exam.  Pap test was negative with negative high-risk HPV in May 2017.  Will repeat Pap test  next year.  Breast exam normal.  Patient will schedule screening mammogram at the breast center now.  Follow-up fasting health labs here.  Colonoscopy done 3 years ago. - CBC; Future - Comp Met (CMET); Future - Lipid panel; Future - TSH; Future - VITAMIN D 25 Hydroxy (Vit-D Deficiency, Fractures); Future  2. S/P tubal ligation Asymptomatic Lt Hydrosalpinx per Pelvic US 07/2016.  Will repeat pelvic US only if experiencing pelvic pain.  3. Perimenopause Light menstrual periods every 3 months x last year.  No  vasomotor menopausal Sx.  Continue to observe.  Recommend vitamin D supplements according to the vitamin D level that will be drawn, calcium intake at 1.2 to 1.5 g/day including nutritional and supplemental calcium, recommend increasing physical activity with weightbearing activity such as walking.  Princess Bruins MD, 4:21 PM 09/26/2017

## 2017-09-26 NOTE — Patient Instructions (Signed)
1. Well female exam with routine gynecological exam Normal gynecologic exam.  Pap test was negative with negative high-risk HPV in May 2017.  Will repeat Pap test next year.  Breast exam normal.  Patient will schedule screening mammogram at the breast center now.  Follow-up fasting health labs here.  Colonoscopy done 3 years ago. - CBC; Future - Comp Met (CMET); Future - Lipid panel; Future - TSH; Future - VITAMIN D 25 Hydroxy (Vit-D Deficiency, Fractures); Future  2. S/P tubal ligation Asymptomatic Lt Hydrosalpinx per Pelvic US 07/2016.  Will repeat pelvic US only if experiencing pelvic pain.  3. Perimenopause Light menstrual periods every 3 months x last year.  No vasomotor menopausal Sx.  Continue to observe.  Recommend vitamin D supplements according to the vitamin D level that will be drawn, calcium intake at 1.2 to 1.5 g/day including nutritional and supplemental calcium, recommend increasing physical activity with weightbearing activity such as walking.  Lisseth, fue un placer verle hoy!  Voy a informarle de sus [employer].

## 2017-10-01 ENCOUNTER — Other Ambulatory Visit: Payer: Self-pay | Admitting: Obstetrics & Gynecology

## 2017-10-01 DIAGNOSIS — Z1231 Encounter for screening mammogram for malignant neoplasm of breast: Secondary | ICD-10-CM

## 2017-11-04 ENCOUNTER — Ambulatory Visit
Admission: RE | Admit: 2017-11-04 | Discharge: 2017-11-04 | Disposition: A | Discharge status: Home / Self Care | Payer: BLUE CROSS/BLUE SHIELD | Source: Ambulatory Visit | Attending: Obstetrics & Gynecology | Admitting: Obstetrics & Gynecology

## 2017-11-04 DIAGNOSIS — Z1231 Encounter for screening mammogram for malignant neoplasm of breast: Secondary | ICD-10-CM

## 2018-08-05 ENCOUNTER — Encounter (HOSPITAL_COMMUNITY): Payer: Self-pay

## 2018-08-05 ENCOUNTER — Other Ambulatory Visit: Payer: Self-pay

## 2018-08-05 ENCOUNTER — Emergency Department (HOSPITAL_COMMUNITY)
Admission: EM | Admit: 2018-08-05 | Discharge: 2018-08-06 | Disposition: A | Discharge status: Home / Self Care | Payer: BLUE CROSS/BLUE SHIELD | Attending: Emergency Medicine | Admitting: Emergency Medicine

## 2018-08-05 DIAGNOSIS — N201 Calculus of ureter: Secondary | ICD-10-CM | POA: Insufficient documentation

## 2018-08-05 DIAGNOSIS — R1011 Right upper quadrant pain: Secondary | ICD-10-CM | POA: Diagnosis present

## 2018-08-05 LAB — CBC
HCT: 34.7 % — ABNORMAL LOW (ref 36.0–46.0)
Hemoglobin: 11.4 g/dL — ABNORMAL LOW (ref 12.0–15.0)
MCH: 29.9 pg (ref 26.0–34.0)
MCHC: 32.9 g/dL (ref 30.0–36.0)
MCV: 91.1 fL (ref 80.0–100.0)
Platelets: 191 10*3/uL (ref 150–400)
RBC: 3.81 MIL/uL — ABNORMAL LOW (ref 3.87–5.11)
RDW: 12.4 % (ref 11.5–15.5)
WBC: 9.8 10*3/uL (ref 4.0–10.5)
nRBC: 0 % (ref 0.0–0.2)

## 2018-08-05 LAB — URINALYSIS, ROUTINE W REFLEX MICROSCOPIC
Bilirubin Urine: NEGATIVE
Glucose, UA: NEGATIVE mg/dL
Hgb urine dipstick: NEGATIVE
Ketones, ur: 5 mg/dL — AB
Leukocytes,Ua: NEGATIVE
Nitrite: NEGATIVE
Protein, ur: NEGATIVE mg/dL
Specific Gravity, Urine: 1.02 (ref 1.005–1.030)
pH: 7 (ref 5.0–8.0)

## 2018-08-05 NOTE — ED Triage Notes (Signed)
Pt arrives POV for eval of flank pain onset 1 hour PTA. Pt reports sudden onset, no hx of same pain. Reports nausea, vomiting. Denies CP/SOB. Pt appears visibly distressed in triage,

## 2018-08-06 ENCOUNTER — Emergency Department (HOSPITAL_COMMUNITY): Payer: BLUE CROSS/BLUE SHIELD

## 2018-08-06 LAB — COMPREHENSIVE METABOLIC PANEL
ALT: 14 U/L (ref 0–44)
AST: 22 U/L (ref 15–41)
Albumin: 4.5 g/dL (ref 3.5–5.0)
Alkaline Phosphatase: 62 U/L (ref 38–126)
Anion gap: 10 (ref 5–15)
BUN: 18 mg/dL (ref 6–20)
CO2: 25 mmol/L (ref 22–32)
Calcium: 9.3 mg/dL (ref 8.9–10.3)
Chloride: 105 mmol/L (ref 98–111)
Creatinine, Ser: 0.95 mg/dL (ref 0.44–1.00)
GFR calc Af Amer: >60 mL/min (ref >60)
GFR calc non Af Amer: >60 mL/min (ref >60)
Glucose, Bld: 112 mg/dL — ABNORMAL HIGH (ref 70–99)
Potassium: 4 mmol/L (ref 3.5–5.1)
Sodium: 140 mmol/L (ref 135–145)
Total Bilirubin: 0.3 mg/dL (ref 0.3–1.2)
Total Protein: 7.6 g/dL (ref 6.5–8.1)

## 2018-08-06 LAB — I-STAT BETA HCG BLOOD, ED (MC, WL, AP ONLY): I-stat hCG, quantitative: <5 m[IU]/mL (ref <5)

## 2018-08-06 LAB — LIPASE, BLOOD: Lipase: 39 U/L (ref 11–51)

## 2018-08-06 MED ORDER — OXYCODONE-ACETAMINOPHEN 5-325 MG PO TABS: 1.0000 | ORAL_TABLET | ORAL | 0 refills | Status: AC | PRN

## 2018-08-06 MED ORDER — ONDANSETRON 4 MG PO TBDP: 4.0000 mg | ORAL_TABLET | Freq: Three times a day (TID) | ORAL | 0 refills | Status: AC | PRN

## 2018-08-06 NOTE — ED Provider Notes (Signed)
[facility] EMERGENCY DEPARTMENT Provider Note   CSN: [id number] Arrival date & time: 08/05/18  2157     History   Chief Complaint Chief Complaint  Patient presents with  . Flank Pain    HPI Allison Rice is a 58 y.o. female.     Patient to ED for evaluation of sudden onset right flank pain around 8:00 pm tonight associated with nausea and vomiting. The pain was described as intense and lasted about one hour before resolving. It has seen returned intermittently with shorter intervals. No fever, hematuria. No history of kidney stones. She is currently asymptomatic. No diarrhea, chest pain, SOB, cough.   The history is provided by the patient. No language interpreter was used.  Flank Pain Pertinent negatives include no chest pain and no shortness of breath.    Past Medical History:  Diagnosis Date  . Anemia   . Fibroid     Patient Active Problem List   Diagnosis Date Noted  . OAB (overactive bladder) 07/12/2016  . Ovarian cyst, left 07/20/2015  . Intramural leiomyoma of uterus 07/20/2015  . History of anemia 07/08/2015    Past Surgical History:  Procedure Laterality Date  . HERNIA REPAIR    . TUBAL LIGATION       OB History    Gravida  2   Para  2   Term      Preterm      AB      Living  2     SAB      TAB      Ectopic      Multiple      Live Births               Home Medications    Prior to Admission medications   Not on File    Family History Family History  Problem Relation Age of Onset  . Hypertension Mother   . Cancer Father        COLON, PROSTATE  . Diabetes Maternal Aunt     Social History Social History   Tobacco Use  . Smoking status: Never Smoker  . Smokeless tobacco: Never Used  Substance Use Topics  . Alcohol use: No    Alcohol/week: 0.0 standard drinks  . Drug use: No     Allergies   Patient has no known allergies.   Review of Systems Review of Systems  Constitutional: Negative  for chills and fever.  Respiratory: Negative.  Negative for shortness of breath.   Cardiovascular: Negative.  Negative for chest pain.  Gastrointestinal: Positive for nausea and vomiting.  Genitourinary: Positive for flank pain. Negative for hematuria.  Skin: Negative.   Neurological: Negative.      Physical Exam Updated Vital Signs BP (!) 141/83   Pulse 66   Temp 98.5 F (36.9 C) (Oral)   Resp 18   Ht 5\' 4"  (1.626 m)   Wt 59 kg   LMP 02/12/2016 (Exact Date)   SpO2 100%   BMI 22.31 kg/m   Physical Exam Constitutional:      Appearance: She is well-developed.  Neck:     Musculoskeletal: Normal range of motion.  Pulmonary:     Effort: Pulmonary effort is normal.  Abdominal:     General: There is no distension.     Palpations: Abdomen is soft.     Tenderness: There is no abdominal tenderness.  Genitourinary:    Comments: No right flank tenderness.  Skin:  General: Skin is warm and dry.  Neurological:     Mental Status: She is alert and oriented to person, place, and time.      ED Treatments / Results  Labs (all labs ordered are listed, but only abnormal results are displayed) Labs Reviewed  COMPREHENSIVE METABOLIC PANEL - Abnormal; Notable for the following components:      Result Value   Glucose, Bld 112 (*)    All other components within normal limits  CBC - Abnormal; Notable for the following components:   RBC 3.81 (*)    Hemoglobin 11.4 (*)    HCT 34.7 (*)    All other components within normal limits  URINALYSIS, ROUTINE W REFLEX MICROSCOPIC - Abnormal; Notable for the following components:   Ketones, ur 5 (*)    All other components within normal limits  LIPASE, BLOOD  I-STAT BETA HCG BLOOD, ED ([facility], [facility], [facility] ONLY)    EKG    Radiology No results found.  Procedures Procedures (including critical care time)  Medications Ordered in ED Medications - No data to display   Initial Impression / Assessment and Plan / ED Course  I have reviewed the  triage vital signs and the nursing notes.  Pertinent labs & imaging results that were available during my care of the patient were reviewed by me and considered in my medical decision making (see chart for details).        Patient to ED with sudden onset right flank pain with N, V. Intermittent since it started, currently no pain or nausea.  Kidney stone suspected based on symptoms and presentation.   CT renal study show 3 mm distal ureteral stone on the right.   Recheck of patient: she has not had any more pain. VSS. Findings/diagnosis discussed as well as care plan. Will provide urinary strainer.   Final Clinical Impressions(s) / ED Diagnoses   Final diagnoses:  None   1. Ureterolithiasis  ED Discharge Orders    None       Charlann Lange, Hershal Coria 08/06/18 [id number]    Mesner, Corene Cornea, MD 08/07/18 [phone]

## 2018-08-06 NOTE — ED Notes (Signed)
Patient transported to CT 

## 2018-08-06 NOTE — Discharge Instructions (Signed)
Use medications as needed for symptoms of pain and/or nausea.   If symptoms do not resolve in 1-2 days, call Dr. Louis Meckel for an appointment to be seen for further management.   Return to the ED if you develop high fever, have uncontrolled pain or vomiting.

## 2018-08-25 ENCOUNTER — Other Ambulatory Visit: Payer: Self-pay | Admitting: Urology

## 2018-08-25 DIAGNOSIS — N200 Calculus of kidney: Secondary | ICD-10-CM

## 2018-08-26 ENCOUNTER — Other Ambulatory Visit: Payer: Self-pay | Admitting: Urology

## 2018-08-26 DIAGNOSIS — N200 Calculus of kidney: Secondary | ICD-10-CM

## 2018-08-28 ENCOUNTER — Ambulatory Visit
Admission: RE | Admit: 2018-08-28 | Discharge: 2018-08-28 | Disposition: A | Discharge status: Home / Self Care | Payer: BLUE CROSS/BLUE SHIELD | Source: Ambulatory Visit | Attending: Urology | Admitting: Urology

## 2018-08-28 DIAGNOSIS — N200 Calculus of kidney: Secondary | ICD-10-CM

## 2018-10-05 IMAGING — MG DIGITAL SCREENING BILATERAL MAMMOGRAM WITH TOMO AND CAD
8 series · 9 of 24 positions shown · non-contrast
Comparison: Previous exam(s).

CLINICAL DATA: Screening.

EXAM:
DIGITAL SCREENING BILATERAL MAMMOGRAM WITH TOMO AND CAD

[L MLO synth-2D]
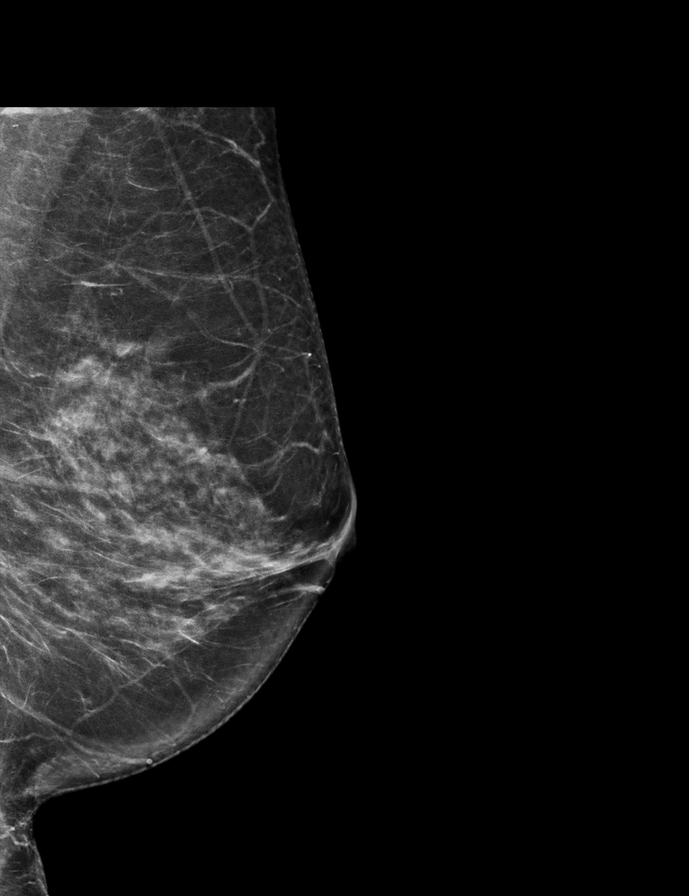

[R CC synth-2D]
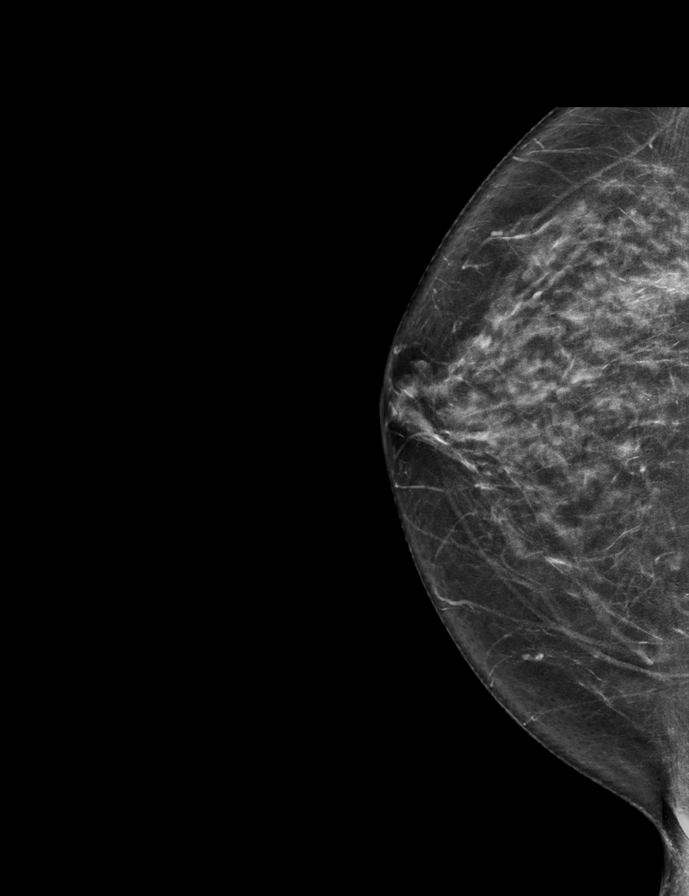

[R MLO synth-2D]
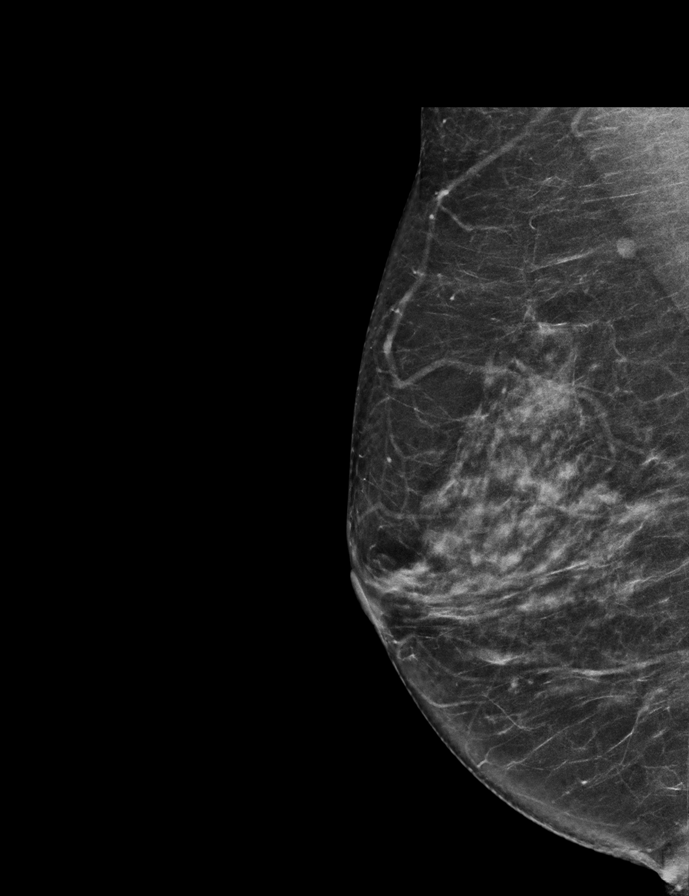

[L CC synth-2D]
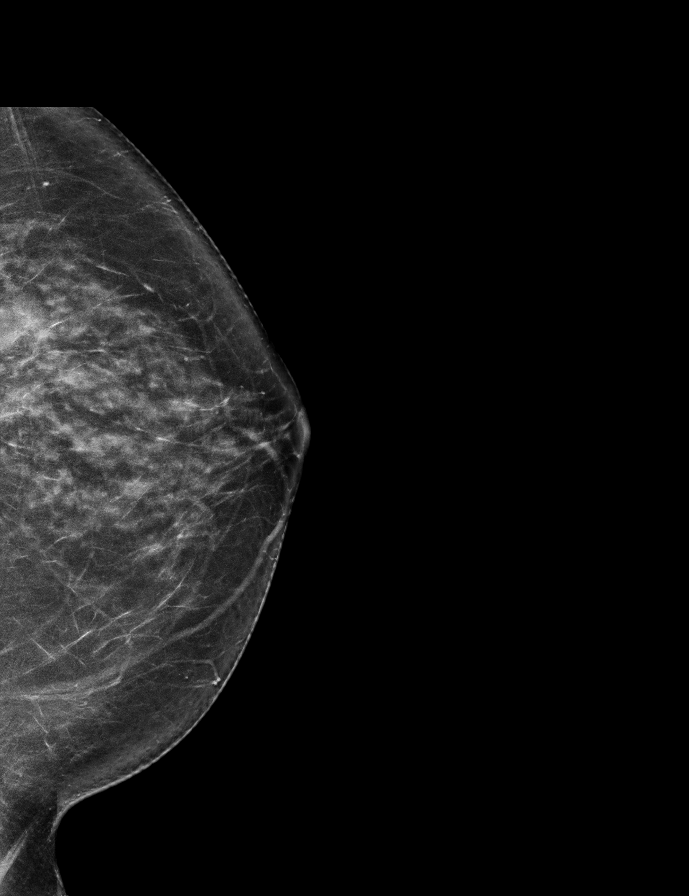

[R CC tomo · 2 of 69 frames shown]
[frame 23/69]
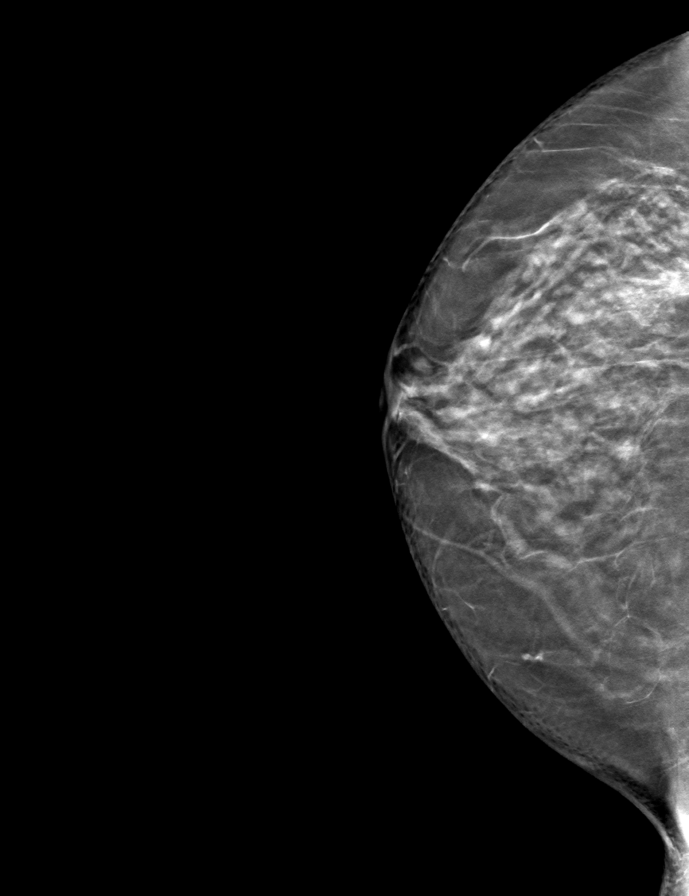
[frame 35/69]
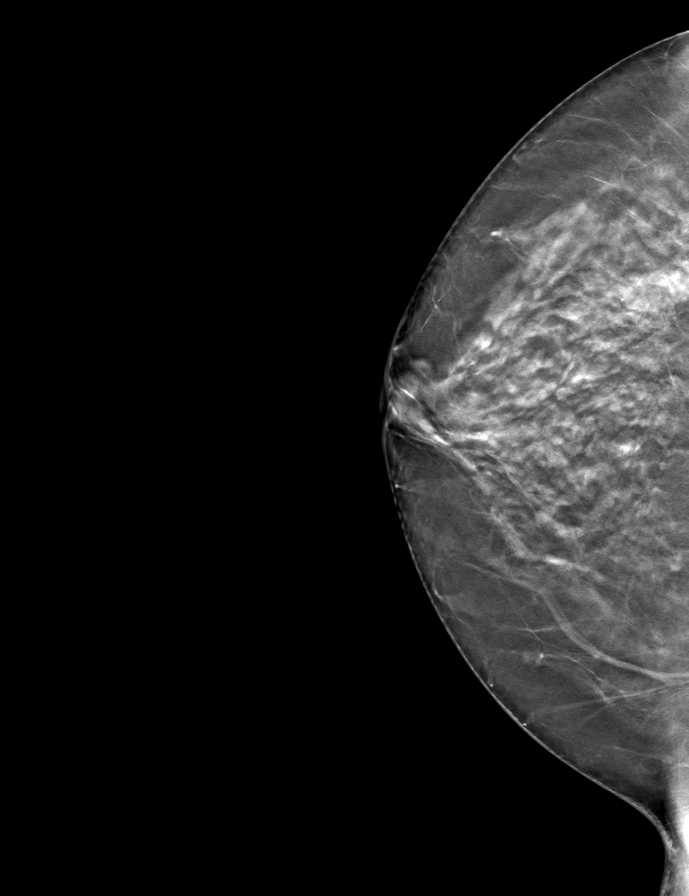

[L MLO tomo · tomo slice 35/70.0]
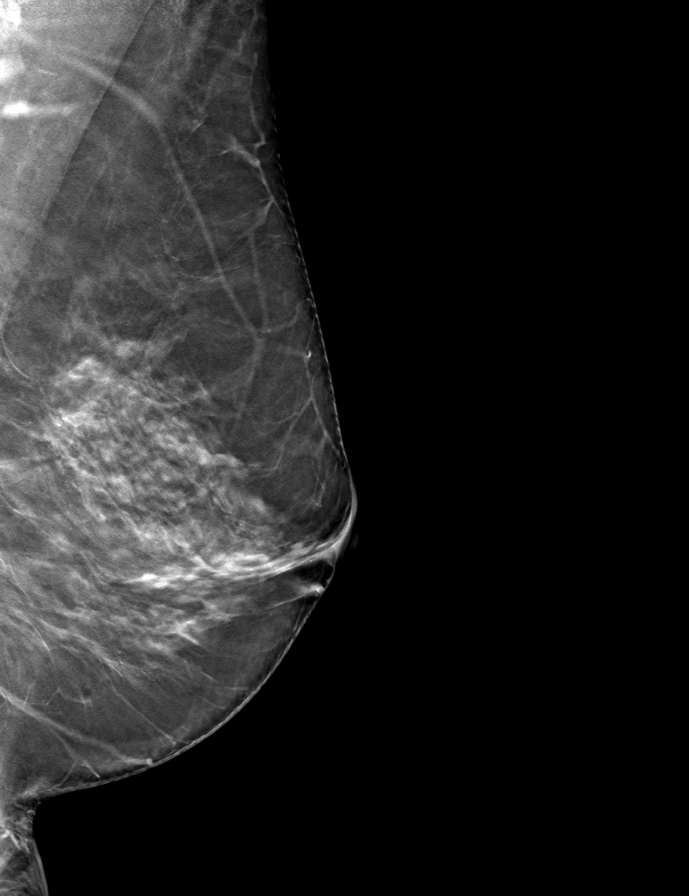

[R MLO tomo · tomo slice 34/67.0]
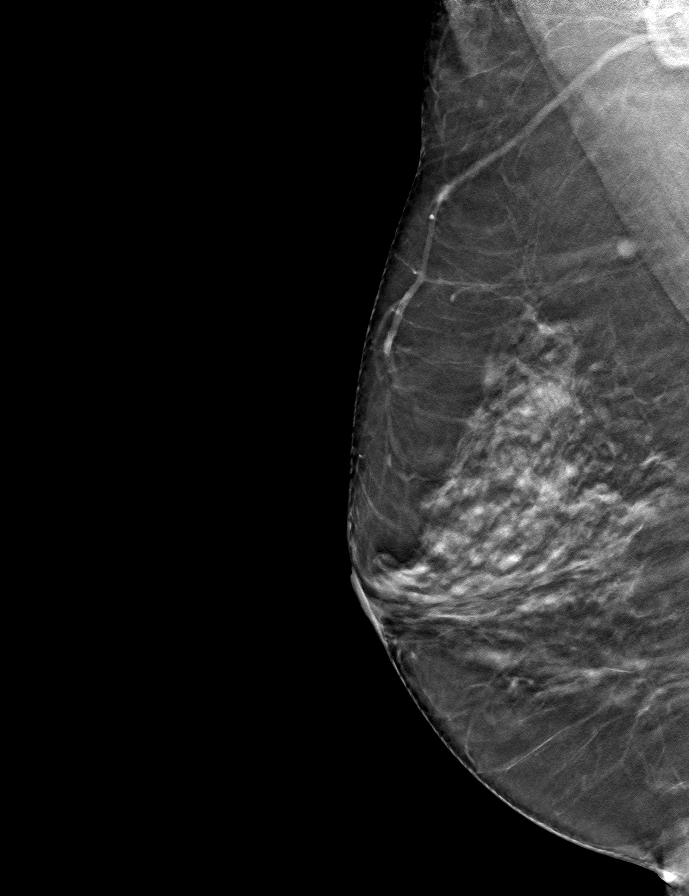

[L CC tomo · tomo slice 35/70.0]
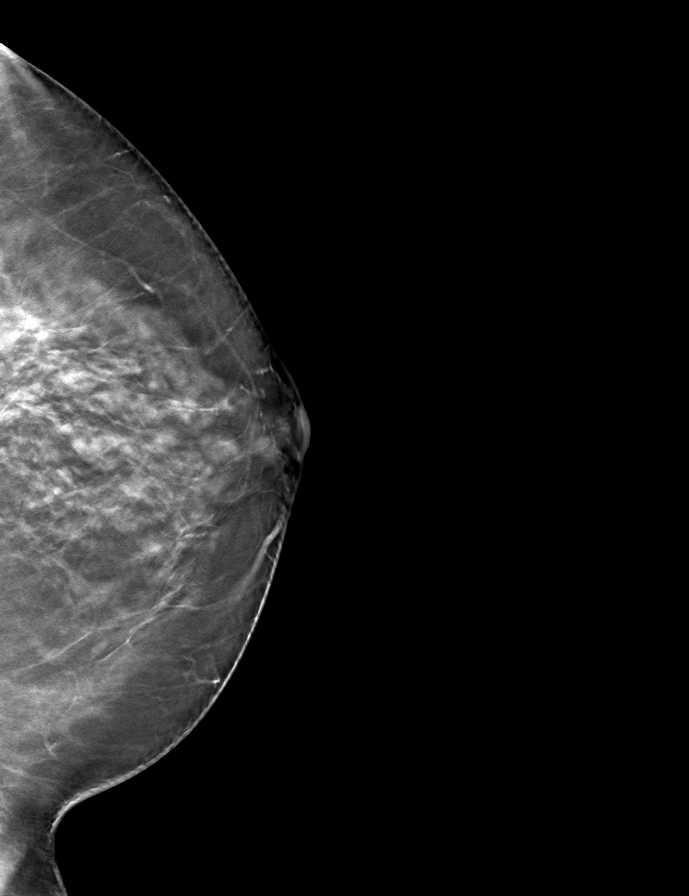

[9 of 24 positions shown; findings below may reference images not displayed]

ACR Breast Density Category c: The breast tissue is heterogeneously
dense, which may obscure small masses.
FINDINGS: There are no findings suspicious for malignancy. Images were
processed with CAD.
IMPRESSION: No mammographic evidence of malignancy. A result letter of this
screening mammogram will be mailed directly to the patient.

RECOMMENDATION:
Screening mammogram in one year. (Code:FT-U-LHB)

BI-RADS CATEGORY  1: Negative.
